# Patient Record
Sex: Male | Born: 1970 | Race: White | Hispanic: No | Smoking: Current every day smoker
Health system: Southern US, Community
[De-identification: ages and names within clinical notes are randomized; demographics above are authoritative.]

## PROBLEM LIST (undated history)

## (undated) DIAGNOSIS — I639 Cerebral infarction, unspecified: Secondary | ICD-10-CM

## (undated) DIAGNOSIS — J449 Chronic obstructive pulmonary disease, unspecified: Secondary | ICD-10-CM

## (undated) DIAGNOSIS — E119 Type 2 diabetes mellitus without complications: Secondary | ICD-10-CM

## (undated) DIAGNOSIS — I10 Essential (primary) hypertension: Secondary | ICD-10-CM

## (undated) HISTORY — DX: Cerebral infarction, unspecified: I63.9

## (undated) HISTORY — PX: BACK SURGERY: SHX140

## (undated) HISTORY — DX: Chronic obstructive pulmonary disease, unspecified: J44.9

## (undated) HISTORY — DX: Essential (primary) hypertension: I10

## (undated) HISTORY — PX: CARDIAC SURGERY: SHX584

---

## 2001-09-27 ENCOUNTER — Encounter: Payer: Self-pay | Admitting: Emergency Medicine

## 2001-09-27 ENCOUNTER — Emergency Department (HOSPITAL_COMMUNITY): Admission: EM | Admit: 2001-09-27 | Discharge: 2001-09-27 | Payer: Self-pay | Admitting: Emergency Medicine

## 2002-07-18 ENCOUNTER — Ambulatory Visit (HOSPITAL_COMMUNITY): Admission: RE | Admit: 2002-07-18 | Discharge: 2002-07-18 | Payer: Self-pay | Admitting: Family Medicine

## 2002-07-18 ENCOUNTER — Encounter: Payer: Self-pay | Admitting: Family Medicine

## 2002-07-19 ENCOUNTER — Encounter: Payer: Self-pay | Admitting: Family Medicine

## 2002-07-19 ENCOUNTER — Ambulatory Visit (HOSPITAL_COMMUNITY): Admission: RE | Admit: 2002-07-19 | Discharge: 2002-07-19 | Payer: Self-pay | Admitting: Family Medicine

## 2002-11-17 ENCOUNTER — Inpatient Hospital Stay (HOSPITAL_COMMUNITY): Admission: EM | Admit: 2002-11-17 | Discharge: 2002-11-21 | Payer: Self-pay | Admitting: Cardiology

## 2004-06-25 ENCOUNTER — Emergency Department (HOSPITAL_COMMUNITY): Admission: EM | Admit: 2004-06-25 | Discharge: 2004-06-25 | Payer: Self-pay | Admitting: Emergency Medicine

## 2004-07-13 ENCOUNTER — Emergency Department (HOSPITAL_COMMUNITY): Admission: EM | Admit: 2004-07-13 | Discharge: 2004-07-13 | Payer: Self-pay | Admitting: Emergency Medicine

## 2004-07-20 ENCOUNTER — Emergency Department (HOSPITAL_COMMUNITY): Admission: EM | Admit: 2004-07-20 | Discharge: 2004-07-20 | Payer: Self-pay | Admitting: *Deleted

## 2004-08-12 ENCOUNTER — Emergency Department (HOSPITAL_COMMUNITY): Admission: EM | Admit: 2004-08-12 | Discharge: 2004-08-12 | Payer: Self-pay | Admitting: Emergency Medicine

## 2004-08-14 ENCOUNTER — Emergency Department (HOSPITAL_COMMUNITY): Admission: EM | Admit: 2004-08-14 | Discharge: 2004-08-14 | Payer: Self-pay | Admitting: Emergency Medicine

## 2004-08-26 ENCOUNTER — Emergency Department (HOSPITAL_COMMUNITY): Admission: EM | Admit: 2004-08-26 | Discharge: 2004-08-26 | Payer: Self-pay | Admitting: Emergency Medicine

## 2004-09-11 ENCOUNTER — Emergency Department (HOSPITAL_COMMUNITY): Admission: EM | Admit: 2004-09-11 | Discharge: 2004-09-11 | Payer: Self-pay | Admitting: Emergency Medicine

## 2004-10-04 ENCOUNTER — Emergency Department (HOSPITAL_COMMUNITY): Admission: EM | Admit: 2004-10-04 | Discharge: 2004-10-05 | Payer: Self-pay | Admitting: Emergency Medicine

## 2004-10-16 ENCOUNTER — Emergency Department (HOSPITAL_COMMUNITY): Admission: EM | Admit: 2004-10-16 | Discharge: 2004-10-17 | Payer: Self-pay | Admitting: Emergency Medicine

## 2004-11-07 ENCOUNTER — Emergency Department (HOSPITAL_COMMUNITY): Admission: EM | Admit: 2004-11-07 | Discharge: 2004-11-07 | Payer: Self-pay | Admitting: Emergency Medicine

## 2005-02-19 ENCOUNTER — Emergency Department: Payer: Self-pay | Admitting: Emergency Medicine

## 2005-04-13 ENCOUNTER — Other Ambulatory Visit: Payer: Self-pay

## 2005-04-13 ENCOUNTER — Emergency Department: Payer: Self-pay | Admitting: Unknown Physician Specialty

## 2005-04-18 ENCOUNTER — Emergency Department: Payer: Self-pay | Admitting: Unknown Physician Specialty

## 2005-05-09 ENCOUNTER — Emergency Department: Payer: Self-pay | Admitting: Emergency Medicine

## 2005-06-25 ENCOUNTER — Emergency Department: Payer: Self-pay | Admitting: Emergency Medicine

## 2005-08-12 ENCOUNTER — Emergency Department: Payer: Self-pay | Admitting: Emergency Medicine

## 2005-08-22 ENCOUNTER — Emergency Department: Payer: Self-pay | Admitting: Emergency Medicine

## 2005-08-27 ENCOUNTER — Inpatient Hospital Stay: Payer: Self-pay | Admitting: Specialist

## 2005-10-08 ENCOUNTER — Emergency Department: Payer: Self-pay | Admitting: Emergency Medicine

## 2005-12-03 ENCOUNTER — Other Ambulatory Visit: Payer: Self-pay

## 2005-12-03 ENCOUNTER — Emergency Department: Payer: Self-pay | Admitting: Emergency Medicine

## 2005-12-07 ENCOUNTER — Other Ambulatory Visit: Payer: Self-pay

## 2005-12-07 ENCOUNTER — Emergency Department: Payer: Self-pay | Admitting: Emergency Medicine

## 2005-12-19 ENCOUNTER — Emergency Department: Payer: Self-pay | Admitting: Emergency Medicine

## 2006-03-27 ENCOUNTER — Emergency Department: Payer: Self-pay | Admitting: Emergency Medicine

## 2006-04-26 ENCOUNTER — Emergency Department: Payer: Self-pay | Admitting: Emergency Medicine

## 2007-01-03 ENCOUNTER — Emergency Department: Payer: Self-pay | Admitting: Internal Medicine

## 2008-09-26 ENCOUNTER — Emergency Department (HOSPITAL_COMMUNITY): Admission: EM | Admit: 2008-09-26 | Discharge: 2008-09-26 | Payer: Self-pay | Admitting: Emergency Medicine

## 2008-09-30 ENCOUNTER — Emergency Department (HOSPITAL_COMMUNITY): Admission: EM | Admit: 2008-09-30 | Discharge: 2008-09-30 | Payer: Self-pay | Admitting: Emergency Medicine

## 2009-02-04 ENCOUNTER — Emergency Department (HOSPITAL_COMMUNITY): Admission: EM | Admit: 2009-02-04 | Discharge: 2009-02-05 | Payer: Self-pay | Admitting: Emergency Medicine

## 2009-02-04 ENCOUNTER — Emergency Department (HOSPITAL_COMMUNITY): Admission: EM | Admit: 2009-02-04 | Discharge: 2009-02-04 | Payer: Self-pay | Admitting: Emergency Medicine

## 2009-02-05 ENCOUNTER — Ambulatory Visit: Payer: Self-pay | Admitting: Psychiatry

## 2009-02-05 ENCOUNTER — Inpatient Hospital Stay (HOSPITAL_COMMUNITY): Admission: AD | Admit: 2009-02-05 | Discharge: 2009-02-22 | Payer: Self-pay | Admitting: Psychiatry

## 2010-01-30 ENCOUNTER — Inpatient Hospital Stay (HOSPITAL_COMMUNITY): Admission: EM | Admit: 2010-01-30 | Discharge: 2010-01-31 | Payer: Self-pay | Admitting: Emergency Medicine

## 2010-01-31 ENCOUNTER — Ambulatory Visit: Payer: Self-pay | Admitting: Psychiatry

## 2010-01-31 ENCOUNTER — Inpatient Hospital Stay (HOSPITAL_COMMUNITY): Admission: AD | Admit: 2010-01-31 | Discharge: 2010-02-06 | Payer: Self-pay | Admitting: Psychiatry

## 2010-02-07 ENCOUNTER — Emergency Department (HOSPITAL_COMMUNITY)
Admission: EM | Admit: 2010-02-07 | Discharge: 2010-02-07 | Payer: Self-pay | Source: Home / Self Care | Admitting: Emergency Medicine

## 2010-03-16 ENCOUNTER — Ambulatory Visit: Payer: Self-pay | Admitting: Psychiatry

## 2010-04-20 ENCOUNTER — Emergency Department (HOSPITAL_COMMUNITY)
Admission: EM | Admit: 2010-04-20 | Discharge: 2010-04-21 | Payer: Self-pay | Source: Home / Self Care | Admitting: Emergency Medicine

## 2010-04-23 ENCOUNTER — Emergency Department (HOSPITAL_COMMUNITY)
Admission: EM | Admit: 2010-04-23 | Discharge: 2010-04-23 | Payer: Self-pay | Source: Home / Self Care | Admitting: Family Medicine

## 2010-04-24 ENCOUNTER — Emergency Department (HOSPITAL_COMMUNITY)
Admission: EM | Admit: 2010-04-24 | Discharge: 2010-04-25 | Disposition: A | Discharge status: Other Acute Inpatient Hospital | Payer: Self-pay | Source: Home / Self Care | Admitting: Emergency Medicine

## 2010-04-24 LAB — BASIC METABOLIC PANEL
BUN: 3 mg/dL — ABNORMAL LOW (ref 6–23)
CO2: 28 mEq/L (ref 19–32)
Calcium: 9.2 mg/dL (ref 8.4–10.5)
Chloride: 96 mEq/L (ref 96–112)
Creatinine, Ser: 1.15 mg/dL (ref 0.4–1.5)
GFR calc Af Amer: >60 mL/min (ref >60)
GFR calc non Af Amer: >60 mL/min (ref >60)
Glucose, Bld: 102 mg/dL — ABNORMAL HIGH (ref 70–99)
Potassium: 3.8 mEq/L (ref 3.5–5.1)
Sodium: 135 mEq/L (ref 135–145)

## 2010-04-24 LAB — ETHANOL: Alcohol, Ethyl (B): 41 mg/dL — ABNORMAL HIGH (ref 0–10)

## 2010-04-24 LAB — RAPID URINE DRUG SCREEN, HOSP PERFORMED
Amphetamines: NOT DETECTED
Barbiturates: NOT DETECTED
Benzodiazepines: POSITIVE — AB
Cocaine: POSITIVE — AB
Opiates: NOT DETECTED
Tetrahydrocannabinol: NOT DETECTED

## 2010-04-24 LAB — CBC
HCT: 45.2 % (ref 39.0–52.0)
Hemoglobin: 15.3 g/dL (ref 13.0–17.0)
MCH: 32.1 pg (ref 26.0–34.0)
MCHC: 33.8 g/dL (ref 30.0–36.0)
MCV: 95 fL (ref 78.0–100.0)
Platelets: 264 10*3/uL (ref 150–400)
RBC: 4.76 MIL/uL (ref 4.22–5.81)
RDW: 15.2 % (ref 11.5–15.5)
WBC: 9.6 10*3/uL (ref 4.0–10.5)

## 2010-04-24 LAB — TRICYCLICS SCREEN, URINE: TCA Scrn: NOT DETECTED

## 2010-04-25 ENCOUNTER — Emergency Department (HOSPITAL_COMMUNITY)
Admission: EM | Admit: 2010-04-25 | Discharge: 2010-04-26 | Disposition: A | Discharge status: Other Acute Inpatient Hospital | Payer: Self-pay | Source: Home / Self Care | Admitting: Emergency Medicine

## 2010-04-25 LAB — DIFFERENTIAL
Basophils Absolute: 0.1 10*3/uL (ref 0.0–0.1)
Basophils Relative: 1 % (ref 0–1)
Eosinophils Absolute: 0.3 10*3/uL (ref 0.0–0.7)
Eosinophils Relative: 3 % (ref 0–5)
Lymphocytes Relative: 24 % (ref 12–46)
Lymphs Abs: 2.3 10*3/uL (ref 0.7–4.0)
Monocytes Absolute: 0.7 10*3/uL (ref 0.1–1.0)
Monocytes Relative: 8 % (ref 3–12)
Neutro Abs: 6.3 10*3/uL (ref 1.7–7.7)
Neutrophils Relative %: 65 % (ref 43–77)

## 2010-04-25 LAB — POCT CARDIAC MARKERS
CKMB, poc: <1 ng/mL — ABNORMAL LOW (ref 1.0–8.0)
Myoglobin, poc: 76.3 ng/mL (ref 12–200)
Troponin i, poc: <0.05 ng/mL (ref 0.00–0.09)

## 2010-04-26 ENCOUNTER — Inpatient Hospital Stay (HOSPITAL_COMMUNITY)
Admission: AD | Admit: 2010-04-26 | Discharge: 2010-05-02 | Payer: Self-pay | Source: Home / Self Care | Attending: Psychiatry | Admitting: Psychiatry

## 2010-04-26 DIAGNOSIS — F1910 Other psychoactive substance abuse, uncomplicated: Secondary | ICD-10-CM

## 2010-04-29 ENCOUNTER — Emergency Department (HOSPITAL_COMMUNITY)
Admission: EM | Admit: 2010-04-29 | Discharge: 2010-04-29 | Payer: Self-pay | Source: Home / Self Care | Admitting: Emergency Medicine

## 2010-05-04 ENCOUNTER — Emergency Department (HOSPITAL_COMMUNITY)
Admission: EM | Admit: 2010-05-04 | Discharge: 2010-05-04 | Payer: Self-pay | Source: Home / Self Care | Admitting: Emergency Medicine

## 2010-05-05 ENCOUNTER — Emergency Department (HOSPITAL_COMMUNITY)
Admission: EM | Admit: 2010-05-05 | Discharge: 2010-05-05 | Payer: Self-pay | Source: Home / Self Care | Admitting: Emergency Medicine

## 2010-05-06 ENCOUNTER — Emergency Department (HOSPITAL_COMMUNITY)
Admission: EM | Admit: 2010-05-06 | Discharge: 2010-05-06 | Payer: Self-pay | Source: Home / Self Care | Admitting: Emergency Medicine

## 2010-05-06 LAB — DIFFERENTIAL
Basophils Absolute: 0.1 10*3/uL (ref 0.0–0.1)
Basophils Absolute: 0.1 10*3/uL (ref 0.0–0.1)
Basophils Relative: 1 % (ref 0–1)
Basophils Relative: 1 % (ref 0–1)
Eosinophils Absolute: 0.4 10*3/uL (ref 0.0–0.7)
Eosinophils Absolute: 0.5 10*3/uL (ref 0.0–0.7)
Eosinophils Relative: 5 % (ref 0–5)
Eosinophils Relative: 7 % — ABNORMAL HIGH (ref 0–5)
Lymphocytes Relative: 35 % (ref 12–46)
Lymphocytes Relative: 25 % (ref 12–46)
Lymphs Abs: 2.5 10*3/uL (ref 0.7–4.0)
Lymphs Abs: 1.6 10*3/uL (ref 0.7–4.0)
Monocytes Absolute: 0.6 10*3/uL (ref 0.1–1.0)
Monocytes Absolute: 0.7 10*3/uL (ref 0.1–1.0)
Monocytes Relative: 8 % (ref 3–12)
Monocytes Relative: 10 % (ref 3–12)
Neutro Abs: 3.6 10*3/uL (ref 1.7–7.7)
Neutro Abs: 3.7 10*3/uL (ref 1.7–7.7)
Neutrophils Relative %: 50 % (ref 43–77)
Neutrophils Relative %: 57 % (ref 43–77)

## 2010-05-06 LAB — URINE CULTURE
Colony Count: >=100000
Culture  Setup Time: 201201091229

## 2010-05-06 LAB — COMPREHENSIVE METABOLIC PANEL
ALT: 69 U/L — ABNORMAL HIGH (ref 0–53)
AST: 39 U/L — ABNORMAL HIGH (ref 0–37)
Albumin: 3.7 g/dL (ref 3.5–5.2)
Alkaline Phosphatase: 59 U/L (ref 39–117)
BUN: 10 mg/dL (ref 6–23)
CO2: 25 mEq/L (ref 19–32)
Calcium: 9.7 mg/dL (ref 8.4–10.5)
Chloride: 101 mEq/L (ref 96–112)
Creatinine, Ser: 1.24 mg/dL (ref 0.4–1.5)
GFR calc Af Amer: >60 mL/min (ref >60)
GFR calc non Af Amer: >60 mL/min (ref >60)
Glucose, Bld: 132 mg/dL — ABNORMAL HIGH (ref 70–99)
Potassium: 4 mEq/L (ref 3.5–5.1)
Sodium: 137 mEq/L (ref 135–145)
Total Bilirubin: 0.6 mg/dL (ref 0.3–1.2)
Total Protein: 6.7 g/dL (ref 6.0–8.3)

## 2010-05-06 LAB — URINE MICROSCOPIC-ADD ON

## 2010-05-06 LAB — POCT I-STAT, CHEM 8
BUN: 12 mg/dL (ref 6–23)
Calcium, Ion: 1.08 mmol/L — ABNORMAL LOW (ref 1.12–1.32)
Chloride: 106 meq/L (ref 96–112)
Creatinine, Ser: 1.1 mg/dL (ref 0.4–1.5)
Glucose, Bld: 96 mg/dL (ref 70–99)
HCT: 48 % (ref 39.0–52.0)
Hemoglobin: 16.3 g/dL (ref 13.0–17.0)
Potassium: 4.1 meq/L (ref 3.5–5.1)
Sodium: 139 meq/L (ref 135–145)
TCO2: 26 mmol/L (ref 0–100)

## 2010-05-06 LAB — URINALYSIS, ROUTINE W REFLEX MICROSCOPIC
Bilirubin Urine: NEGATIVE
Bilirubin Urine: NEGATIVE
Bilirubin Urine: NEGATIVE
Ketones, ur: NEGATIVE mg/dL
Ketones, ur: NEGATIVE mg/dL
Ketones, ur: NEGATIVE mg/dL
Nitrite: NEGATIVE
Nitrite: NEGATIVE
Nitrite: NEGATIVE
Protein, ur: 30 mg/dL — AB
Protein, ur: 100 mg/dL — AB
Protein, ur: NEGATIVE mg/dL
Specific Gravity, Urine: 1.006 (ref 1.005–1.030)
Specific Gravity, Urine: 1.005 (ref 1.005–1.030)
Specific Gravity, Urine: 1.009 (ref 1.005–1.030)
Urine Glucose, Fasting: NEGATIVE mg/dL
Urine Glucose, Fasting: NEGATIVE mg/dL
Urine Glucose, Fasting: NEGATIVE mg/dL
Urobilinogen, UA: 0.2 mg/dL (ref 0.0–1.0)
Urobilinogen, UA: 0.2 mg/dL (ref 0.0–1.0)
Urobilinogen, UA: 0.2 mg/dL (ref 0.0–1.0)
pH: 6.5 (ref 5.0–8.0)
pH: 6 (ref 5.0–8.0)
pH: 6 (ref 5.0–8.0)

## 2010-05-06 LAB — RAPID URINE DRUG SCREEN, HOSP PERFORMED
Amphetamines: NOT DETECTED
Barbiturates: NOT DETECTED
Benzodiazepines: POSITIVE — AB
Cocaine: NOT DETECTED
Opiates: NOT DETECTED
Tetrahydrocannabinol: NOT DETECTED

## 2010-05-06 LAB — URINALYSIS, MICROSCOPIC ONLY
Bilirubin Urine: NEGATIVE
Ketones, ur: NEGATIVE mg/dL
Nitrite: NEGATIVE
Protein, ur: 30 mg/dL — AB
Specific Gravity, Urine: 1.009 (ref 1.005–1.030)
Urine Glucose, Fasting: NEGATIVE mg/dL
Urobilinogen, UA: 0.2 mg/dL (ref 0.0–1.0)
pH: 6.5 (ref 5.0–8.0)

## 2010-05-06 LAB — CBC
HCT: 45.9 % (ref 39.0–52.0)
HCT: 43.7 % (ref 39.0–52.0)
Hemoglobin: 15.3 g/dL (ref 13.0–17.0)
Hemoglobin: 14.9 g/dL (ref 13.0–17.0)
MCH: 31.6 pg (ref 26.0–34.0)
MCH: 32 pg (ref 26.0–34.0)
MCHC: 33.3 g/dL (ref 30.0–36.0)
MCHC: 34.1 g/dL (ref 30.0–36.0)
MCV: 94.8 fL (ref 78.0–100.0)
MCV: 94 fL (ref 78.0–100.0)
Platelets: 260 10*3/uL (ref 150–400)
Platelets: 262 10*3/uL (ref 150–400)
RBC: 4.84 MIL/uL (ref 4.22–5.81)
RBC: 4.65 MIL/uL (ref 4.22–5.81)
RDW: 15 % (ref 11.5–15.5)
RDW: 14.8 % (ref 11.5–15.5)
WBC: 7.2 10*3/uL (ref 4.0–10.5)
WBC: 6.5 10*3/uL (ref 4.0–10.5)

## 2010-05-06 LAB — ETHANOL

## 2010-05-07 ENCOUNTER — Emergency Department (HOSPITAL_COMMUNITY)
Admission: EM | Admit: 2010-05-07 | Discharge: 2010-05-07 | Payer: Self-pay | Source: Home / Self Care | Admitting: Emergency Medicine

## 2010-05-08 LAB — URINALYSIS, ROUTINE W REFLEX MICROSCOPIC
Bilirubin Urine: NEGATIVE
Bilirubin Urine: NEGATIVE
Ketones, ur: NEGATIVE mg/dL
Ketones, ur: NEGATIVE mg/dL
Nitrite: NEGATIVE
Nitrite: POSITIVE — AB
Protein, ur: NEGATIVE mg/dL
Protein, ur: 30 mg/dL — AB
Specific Gravity, Urine: 1.017 (ref 1.005–1.030)
Specific Gravity, Urine: 1.023 (ref 1.005–1.030)
Urine Glucose, Fasting: NEGATIVE mg/dL
Urine Glucose, Fasting: NEGATIVE mg/dL
Urobilinogen, UA: 1 mg/dL (ref 0.0–1.0)
Urobilinogen, UA: 0.2 mg/dL (ref 0.0–1.0)
pH: 7.5 (ref 5.0–8.0)
pH: 6.5 (ref 5.0–8.0)

## 2010-05-08 LAB — URINE MICROSCOPIC-ADD ON

## 2010-05-08 LAB — BASIC METABOLIC PANEL
BUN: 15 mg/dL (ref 6–23)
CO2: 25 mEq/L (ref 19–32)
Calcium: 9.3 mg/dL (ref 8.4–10.5)
Chloride: 105 mEq/L (ref 96–112)
Creatinine, Ser: 1.15 mg/dL (ref 0.4–1.5)
GFR calc Af Amer: >60 mL/min (ref >60)
GFR calc non Af Amer: >60 mL/min (ref >60)
Glucose, Bld: 97 mg/dL (ref 70–99)
Potassium: 4.5 mEq/L (ref 3.5–5.1)
Sodium: 138 mEq/L (ref 135–145)

## 2010-05-08 LAB — CBC
HCT: 46 % (ref 39.0–52.0)
Hemoglobin: 15.5 g/dL (ref 13.0–17.0)
MCH: 32.1 pg (ref 26.0–34.0)
MCHC: 33.7 g/dL (ref 30.0–36.0)
MCV: 95.2 fL (ref 78.0–100.0)
Platelets: 275 10*3/uL (ref 150–400)
RBC: 4.83 MIL/uL (ref 4.22–5.81)
RDW: 14.7 % (ref 11.5–15.5)
WBC: 7.3 10*3/uL (ref 4.0–10.5)

## 2010-05-08 LAB — OCCULT BLOOD, POC DEVICE: Fecal Occult Bld: NEGATIVE

## 2010-05-08 LAB — URINE CULTURE
Colony Count: >=100000
Culture  Setup Time: 201201161548

## 2010-05-08 LAB — RPR: RPR Ser Ql: NONREACTIVE

## 2010-05-09 ENCOUNTER — Emergency Department (HOSPITAL_COMMUNITY)
Admission: EM | Admit: 2010-05-09 | Discharge: 2010-05-09 | Payer: Self-pay | Source: Home / Self Care | Admitting: Emergency Medicine

## 2010-05-11 ENCOUNTER — Emergency Department (HOSPITAL_COMMUNITY)
Admission: EM | Admit: 2010-05-11 | Discharge: 2010-05-12 | Disposition: A | Discharge status: Other Acute Inpatient Hospital | Payer: Self-pay | Source: Home / Self Care | Admitting: Emergency Medicine

## 2010-05-12 ENCOUNTER — Inpatient Hospital Stay (HOSPITAL_COMMUNITY)
Admission: AD | Admit: 2010-05-12 | Discharge: 2010-05-16 | Payer: Self-pay | Source: Other Acute Inpatient Hospital | Attending: Psychiatry | Admitting: Psychiatry

## 2010-05-12 DIAGNOSIS — F332 Major depressive disorder, recurrent severe without psychotic features: Secondary | ICD-10-CM

## 2010-05-13 LAB — URINE CULTURE
Colony Count: 90000
Culture  Setup Time: 201201171534

## 2010-05-13 LAB — GC/CHLAMYDIA PROBE AMP, GENITAL
Chlamydia, DNA Probe: NEGATIVE
GC Probe Amp, Genital: NEGATIVE

## 2010-05-14 LAB — SALICYLATE LEVEL: Salicylate Lvl: <4 mg/dL (ref 2.8–20.0)

## 2010-05-14 LAB — DIFFERENTIAL
Basophils Absolute: 0.1 10*3/uL (ref 0.0–0.1)
Basophils Relative: 1 % (ref 0–1)
Eosinophils Absolute: 0.5 10*3/uL (ref 0.0–0.7)
Eosinophils Relative: 6 % — ABNORMAL HIGH (ref 0–5)
Lymphocytes Relative: 32 % (ref 12–46)
Lymphs Abs: 2.4 10*3/uL (ref 0.7–4.0)
Monocytes Absolute: 0.7 10*3/uL (ref 0.1–1.0)
Monocytes Relative: 9 % (ref 3–12)
Neutro Abs: 3.9 10*3/uL (ref 1.7–7.7)
Neutrophils Relative %: 52 % (ref 43–77)

## 2010-05-14 LAB — URINALYSIS, ROUTINE W REFLEX MICROSCOPIC
Protein, ur: 30 mg/dL — AB
Specific Gravity, Urine: 1.023 (ref 1.005–1.030)
Urine Glucose, Fasting: NEGATIVE mg/dL
pH: 5.5 (ref 5.0–8.0)

## 2010-05-14 LAB — COMPREHENSIVE METABOLIC PANEL
ALT: 53 U/L (ref 0–53)
AST: 36 U/L (ref 0–37)
Calcium: 9.1 mg/dL (ref 8.4–10.5)
Chloride: 102 mEq/L (ref 96–112)
Creatinine, Ser: 1.11 mg/dL (ref 0.4–1.5)
GFR calc Af Amer: >60 mL/min (ref >60)
Glucose, Bld: 117 mg/dL — ABNORMAL HIGH (ref 70–99)
Sodium: 138 mEq/L (ref 135–145)
Total Bilirubin: 0.3 mg/dL (ref 0.3–1.2)
Total Protein: 6.6 g/dL (ref 6.0–8.3)

## 2010-05-14 LAB — URINE MICROSCOPIC-ADD ON

## 2010-05-14 LAB — ETHANOL: Alcohol, Ethyl (B): <5 mg/dL (ref 0–10)

## 2010-05-14 LAB — CBC
MCHC: 35.1 g/dL (ref 30.0–36.0)
Platelets: 239 10*3/uL (ref 150–400)
RBC: 4.36 MIL/uL (ref 4.22–5.81)
RDW: 14.2 % (ref 11.5–15.5)
WBC: 7.5 10*3/uL (ref 4.0–10.5)

## 2010-05-14 LAB — RAPID URINE DRUG SCREEN, HOSP PERFORMED: Barbiturates: NOT DETECTED

## 2010-05-14 LAB — LIPASE, BLOOD: Lipase: 32 U/L (ref 11–59)

## 2010-05-14 LAB — ACETAMINOPHEN LEVEL
Acetaminophen (Tylenol), Serum: 17.3 ug/mL (ref 10–30)
Acetaminophen (Tylenol), Serum: 16.4 ug/mL (ref 10–30)
Acetaminophen (Tylenol), Serum: <10 ug/mL — ABNORMAL LOW (ref 10–30)

## 2010-05-14 LAB — URINE CULTURE

## 2010-05-15 LAB — VITAMIN B12: Vitamin B-12: 385 pg/mL (ref 211–911)

## 2010-05-15 LAB — LIPID PANEL
Cholesterol: 312 mg/dL — ABNORMAL HIGH (ref 0–200)
HDL: 43 mg/dL (ref >39)
Triglycerides: 393 mg/dL — ABNORMAL HIGH (ref <150)

## 2010-05-15 LAB — HEMOGLOBIN A1C: Mean Plasma Glucose: 105 mg/dL (ref <117)

## 2010-05-15 LAB — TSH: TSH: 0.87 u[IU]/mL (ref 0.350–4.500)

## 2010-05-15 NOTE — Consult Note (Addendum)
Andrew [name]                ACCOUNT NO.:  [id number]  MEDICAL RECORD NO.:  [id number]          PATIENT TYPE:  EMS  LOCATION:  ED                           FACILITY:  [facility]  PHYSICIAN:  Marlis Edelson, DO        DATE OF BIRTH:  03-23-1971  DATE OF CONSULTATION:  05/12/2010 DATE OF DISCHARGE:                                CONSULTATION   CONSULTATION REQUESTED BY:  Ilene Qua ED physician staff.  CHIEF COMPLAINT:  Suicidal thoughts.  HISTORY OF PRESENT ILLNESS:  Andrew [name] is a 40 year old Caucasian male admitted to the psychiatric section of the [facility] emergency department on May 11, 2010, when he presented status post overdose.  He related that he is suicidal and depressed and that he has been using drugs and alcohol.  He states, "I just give up." "I am tired of it, I don't want to live any more."  He stated that he tried to stab himself, but his brother took the knife away.  He tried toshoot himself, but his brother took the gun away, so he went to overdose.  He stated he overdosed on other unspecified medication but the one that he was sure about was Percocet.  He related that was his first time using opiates in this manner.  He has no history of addiction to opiates.  He states he has been depressed for a long time and the only thing he does about it is drugs and alcohol.  He referenced being on an antidepressant and Seroquel but could not recall the name of the antidepressant.  He has been using alcohol daily with a case of beer being consumed on a daily basis.  He stated he has been drinking since he was 28-66 years of age.  His last use was before admission.  He has had a history of alcohol withdrawal seizures and DTs in the past.  Cocaine use began around the age of 32.  He states he does not do it any more but his last use was 2 weeks ago.  He stated he does about an eight- ball daily.  He denied any other  substance use.  He reports having depression for some time and having auditory hallucinations.  PAST PSYCHIATRIC HISTORY:  Andrew [name] has been admitted to the Behavioral Health Unit in the past.  He has previous diagnosis including polysubstance abuse, alcohol dependence, substance-induced auditory hallucinations, possibility of personality disorder and mood disorder, NOS.  He reports previous suicidal attempts x3-4.  He has also had a history of cutting in the past which reduces pain.  He reports that his antidepressant is not working.  PAST MEDICAL HISTORY:  Previous appendectomy.  He states he is currently being treated for a prostate infection.  He has no history of loss of consciousness or head trauma, seizure history has only been associated with withdrawal of alcohol.  ALLERGIES:  TORADOL and PENICILLIN.  CURRENT MEDICATIONS:  Cipro 500 mg p.o. b.i.d. x26 days.  SOCIAL HISTORY:  He is divorced, has been married x1, no children. Education ninth grade and then obtained a  GED.  Work, Holiday representative. Military history none.  Legal history none.  Religious preference is none.  TRAUMA HISTORY:  He denies any significant history of childhood trauma.  FAMILY HISTORY:  He relates drug abuse on both sides of his family.  SUBSTANCE ABUSE HISTORY:  In addition to the above-noted use, he smokes approximately two packs of cigarettes per day.  He has smoked since the age of 24.  MENTAL STATUS EXAMINATION:  He is a well-developed, well-nourished Caucasian male who appeared sad in appearance.  He was wearing hospital issued pajamas. He was lying on a hospital gurney in the emergency department on his right side.  His eyes were closed.  He established no eye contact with the examiner.  His speech was clear and coherent, regular in rate and rhythm.  His mood was expressed as really depressed, "I just want to die, I will get it right some day."  Affect blunted. Thought process was linear.   Thought content positive for voices.  He is stating he hears 1-2 voices but it is hard to describe what they are saying.  He relates current paranoid thoughts, no delusions.  Positive suicidal thoughts, no homicidality.  Judgment impaired, insight shallow. Psychomotor activity diminished.  ASSESSMENT:  AXIS I: 1. Status post overdose as a suicidal attempt. 2. 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 3. Polysubstance abuse (alcohol and cocaine). 4. Substance-induced auditory hallucinations. AXIS II:  Rule out personality disorder based on previous history and psychiatric assessments. AXIS III:  Prostate infection. AXIS IV:  Chronic and severe substance use. AXIS V:  Global assessment of functioning, 20.  TREATMENT PLAN:  Andrew [name] will remain in the emergency department until a bed is available in the Behavioral Health Unit.  I do recommend because of severity of his suicidal ideation, that he be admitted to the [facility].  Involuntary admission may be needed if the patient does not agree to inpatient stay.  Discontinue current p.r.n. Ativan and start Librium detox protocol including supplementation with thiamine.  His previous psychiatric medications include Celexa 20 mg daily which has been ineffective, that will be discontinued as well as hydroxyzine 25 mg every 6 hours as needed because we are uncertain if he may have overdosed on hydroxyzine.  He can continue Seroquel 400 mg at bedtime which may help with sleep and auditory hallucinations.  Further adjustments and recommendations can be made following his detoxification.  Thank you for this consultation.          ______________________________ Marlis Edelson, DO     DB/MEDQ  D:  05/12/2010  T:  05/12/2010  Job:  [id number]  Electronically Signed by Marlis Edelson MD on 05/15/2010 10:10:15 PM

## 2010-05-15 NOTE — H&P (Addendum)
  NAMEYI, HAUGAN                ACCOUNT NO.:  [id number]  MEDICAL RECORD NO.:  [id number]          PATIENT TYPE:  IPS  LOCATION:  0508                          FACILITY:  [facility]  PHYSICIAN:  Marlis Edelson, DO        DATE OF BIRTH:  08/05/1970  DATE OF ADMISSION:  05/12/2010 DATE OF DISCHARGE:                      PSYCHIATRIC ADMISSION ASSESSMENT   CHIEF COMPLAINT:  Depression.  HISTORY OF A CHIEF COMPLAINT:  Andrew [name] is a 40 year old Caucasian male admitted with suicidal ideation and status post overdose on Percocet.  He has a history of polysubstance dependence and mood disorder, NOS.  He had had previous suicidal attempts and Behavioral Health hospitalizations.  He was seen initially in the [facility] Emergency Department and was diagnosed with polysubstance dependence, substance-induced auditory hallucinations, and mood disorder, likely secondary to substance substances.  His overdose was due to suicidal ideation.  He was placed on a Librium detox protocol and transferred to [facility] due to increased suicidal ideation.  Please see the dictated psychiatric consultation of 05/12/2010 for past medical history, past psychiatric history, medications, social history, substance abuse, and family history.  MENTAL STATUS EXAMINATION:  On the day of admission, he was more alert, pleasant, cooperative, and engaging today.  His eye contact was more appropriate.  Motor behavior within normal limits.  His speech was clear and coherent.  His mood has improved affect was more full range. Thought process linear, logical, and goal-directed.  He states he feels much clearer.  He is denying perceptual symptoms, ideas of reference, or delusions.  No current suicidal or homicidal thought, intent, or plan. His judgment is grossly intact.  His insight into the need for treatment has increased.  ASSESSMENT:  AXIS I:  Polysubstance dependence,  substance-induced auditory hallucinations, mood disorder, likely secondary to substances, status post overdose with suicidal ideation. AXIS II:  Deferred. AXIS III:  Prostatitis. AXIS IV:  Please see psychiatric consultation. AXIS V:  Currently 45.  TREATMENT PLAN:  He is admitted to the adult unit where he is on a Librium detox protocol.  He is being integrated in substance abuse treatment groups.  We will continue Cipro 500 mg p.o. twice per day for 26 days for prostatitis, continue Seroquel q.h.s. for hallucinosis, and initiate Wellbutrin 150 mg XR p.o. daily for depression.  We will monitor mood, affect, and look at further substance abuse treatment options as well as psychiatric follow-up prior to discharge.          ______________________________ Marlis Edelson, DO     DB/MEDQ  D:  05/13/2010  T:  05/13/2010  Job:  [id number]  Electronically Signed by Marlis Edelson MD on 05/15/2010 10:10:18 PM

## 2010-05-16 LAB — FOLATE RBC: RBC Folate: 589 ng/mL (ref 180–600)

## 2010-05-16 LAB — FOLATE: Folate: 8.2 ng/mL

## 2010-05-22 ENCOUNTER — Emergency Department (HOSPITAL_COMMUNITY)
Admission: EM | Admit: 2010-05-22 | Discharge: 2010-05-24 | Disposition: A | Discharge status: Home / Self Care | Payer: Self-pay | Attending: Emergency Medicine | Admitting: Emergency Medicine

## 2010-05-22 DIAGNOSIS — F1010 Alcohol abuse, uncomplicated: Secondary | ICD-10-CM | POA: Insufficient documentation

## 2010-05-22 DIAGNOSIS — IMO0002 Reserved for concepts with insufficient information to code with codable children: Secondary | ICD-10-CM | POA: Insufficient documentation

## 2010-05-22 DIAGNOSIS — I10 Essential (primary) hypertension: Secondary | ICD-10-CM | POA: Insufficient documentation

## 2010-05-22 DIAGNOSIS — F1020 Alcohol dependence, uncomplicated: Secondary | ICD-10-CM

## 2010-05-22 LAB — DIFFERENTIAL
Lymphocytes Relative: 30 % (ref 12–46)
Lymphs Abs: 2.5 10*3/uL (ref 0.7–4.0)
Monocytes Relative: 7 % (ref 3–12)
Neutrophils Relative %: 60 % (ref 43–77)

## 2010-05-22 LAB — RAPID URINE DRUG SCREEN, HOSP PERFORMED
Opiates: NOT DETECTED
Tetrahydrocannabinol: NOT DETECTED

## 2010-05-22 LAB — BASIC METABOLIC PANEL
BUN: 9 mg/dL (ref 6–23)
CO2: 24 mEq/L (ref 19–32)
Chloride: 104 mEq/L (ref 96–112)
Creatinine, Ser: 1.06 mg/dL (ref 0.4–1.5)
Glucose, Bld: 113 mg/dL — ABNORMAL HIGH (ref 70–99)
Potassium: 4 mEq/L (ref 3.5–5.1)

## 2010-05-22 LAB — CBC
HCT: 48.3 % (ref 39.0–52.0)
Hemoglobin: 17.3 g/dL — ABNORMAL HIGH (ref 13.0–17.0)
MCH: 32.8 pg (ref 26.0–34.0)
MCV: 91.5 fL (ref 78.0–100.0)
RBC: 5.28 MIL/uL (ref 4.22–5.81)
WBC: 8.3 10*3/uL (ref 4.0–10.5)

## 2010-05-23 DIAGNOSIS — F1920 Other psychoactive substance dependence, uncomplicated: Secondary | ICD-10-CM

## 2010-05-27 NOTE — Discharge Summary (Signed)
NAMEDIN, BOOKWALTER                ACCOUNT NO.:  [id number]  MEDICAL RECORD NO.:  [id number]          PATIENT TYPE:  IPS  LOCATION:  0508                          FACILITY:  [facility]  PHYSICIAN:  Marlis Edelson, DO        DATE OF BIRTH:  1971-03-23  DATE OF ADMISSION:  05/12/2010 DATE OF DISCHARGE:  05/16/2010                              DISCHARGE SUMMARY   CHIEF COMPLAINT:  Depression.  HISTORY OF CHIEF COMPLAINT:  Andrew [name] is a 40 year old Caucasian male admitted to the [facility] on May 12, 2010 with chief complaint of depression with suicidal ideation.  He had overdosed on Percocet.  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  He has had previous suicidal attempts and Behavioral Health hospitalizations.  He was seen initially at the [facility] Emergency Department where he was diagnosed with polysubstance dependence, substance-induced auditory hallucinations and mood disorder.  His overdose was due to suicidal ideation.  He was placed on Librium detox protocol and transferred to the [facility] due to suicidal ideation.  The patient was seen in consultation on May 12, 2010.  HOSPITAL COURSE:  Andrew [name] was admitted to the adult unit where he was integrated into substance abuse groups as well as AANA.  In addition, as stated above, he was on a Librium detox protocol due to his history of alcohol consumption.  He met with a psychiatric provider on a daily basis.  He was followed on a daily basis by the discharge planning group, where by May 14, 2010, he denied suicidal ideation or homicidal ideation.  He displayed no hypomania, mania or psychosis during his course of hospitalization.  On January 26, he was noted to have a much broader affect.  His engagement in groups was better than it had been during previous hospital stays.  Hemoglobin A1c was checked and was  within normal limits at 5.3%.  Folate was normal at 8.2 mg/mL.  He was on medications that included Seroquel 600 mg at bedtime, ciprofloxacin 500 mg twice a day to be prescribed through June 07, 2010 due to prostate infection.  He was also on Wellbutrin 150 mg XL daily which was started during the course of hospitalization.  On this medication regimen, he related decrease of suicidal ideation.  He stated he had a new focus which would be life, his job, etc.  He stated he was not thinking about alcohol and drugs at this point.  He was taking in meetings and was talking in the meetings which was something new for him, per the therapist, who had known him from previous admissions.  He reported that he was enjoying AA and he was much more clear.  His ADLs were intact.  His sleep was intact.  He stated it was the best that it had been in a while.  His appetite was good.  He expressed no suicidal or homicidal ideation.  We did discuss his dietary interventions using fish oil to help with management the need to sustain from alcohol long- term.  In addition, we  talked about other forms of substance abuse.  He also reported that his mood was much broader and he felt better than he had felt in some time.  Additional laboratory showed his TSH to be within normal limits.  A vitamin B12 level was 385 pg/mL.  His total cholesterol level was elevated at 312 mg/dL, triglycerides elevated at 393 mg/dL, HDL was good at 43 mg/dL.  LDL was increased at 190 mg/dL and VLDL was increased to 79 mg/dL.  We discussed dietary interventions.  CONSULTATIONS:  None.  COMPLICATIONS:  None.  LABORATORY IMAGING:  As noted above.  PROCEDURES:  None.  MENTAL STATUS EXAM:  Exam at the time of discharge showed him to be pleasant, cooperative, casually dressed with appropriate eye contact. Motor behavior was normal.  Speech was clear, coherent, regular rate, rhythm and volume.  Level of consciousness was alert.   He defined his mood as good, "in fact the best it had been in some time."  Affect was broader, anxiety level zero, depressive level zero, suicidal ideation none, homicidal ideation none.  No thought intent or plan of harming self or others.  He denied perceptual symptoms, ideas of reference, delusions or paranoid ideation.  His judgment was grossly intact.  His insight had increased.  He was cognitively intact.  DISCHARGE DIAGNOSES:  AXIS I:  Polysubstance dependence, mood disorder secondary to polysubstance dependence, history of substance-induced auditory hallucinations. AXIS II:  Deferred. AXIS III:  Prostatitis. AXIS IV:  Long-term substance abuse. AXIS V:  55.  DISCHARGE INSTRUCTIONS:  The patient is to return to the hospital for any marked change in mood or affect.  He is to return for any development of suicidal or homicidal ideation.  In addition, he is to seek emergent treatment for any adverse reactions to medication.  FOLLOWUP:  [facility] on Tuesday, April 23, 2010 at 9:00 a.m.  He also has followup with [facility] on the Monday preceding his Crisis Services followup.  DISCHARGE MEDICATIONS: 1. Wellbutrin 150 mg XL p.o. daily. 2. Ciprofloxacin 500 mg twice daily. 3. Quetiapine 600 mg daily at bedtime.  CONDITION ON DISCHARGE:  Improved with no suicidal or homicidal ideation.  No significant withdrawal symptoms.  No hypomania, mania or psychosis.  PROGNOSIS:  Guarded due to his chronic substance use and history of repeated relapses.          ______________________________ Marlis Edelson, DO     DB/MEDQ  D:  05/26/2010  T:  05/26/2010  Job:  [id number]  Electronically Signed by Marlis Edelson MD on 05/27/2010 07:49:50 PM

## 2010-06-16 ENCOUNTER — Emergency Department (HOSPITAL_COMMUNITY)
Admission: EM | Admit: 2010-06-16 | Discharge: 2010-06-21 | Disposition: A | Discharge status: Other Acute Inpatient Hospital | Payer: Self-pay | Attending: Emergency Medicine | Admitting: Emergency Medicine

## 2010-06-16 ENCOUNTER — Emergency Department (HOSPITAL_COMMUNITY): Payer: Self-pay

## 2010-06-16 DIAGNOSIS — Z981 Arthrodesis status: Secondary | ICD-10-CM | POA: Insufficient documentation

## 2010-06-16 DIAGNOSIS — R42 Dizziness and giddiness: Secondary | ICD-10-CM | POA: Insufficient documentation

## 2010-06-16 DIAGNOSIS — R45851 Suicidal ideations: Secondary | ICD-10-CM | POA: Insufficient documentation

## 2010-06-16 DIAGNOSIS — R319 Hematuria, unspecified: Secondary | ICD-10-CM | POA: Insufficient documentation

## 2010-06-16 DIAGNOSIS — M79609 Pain in unspecified limb: Secondary | ICD-10-CM | POA: Insufficient documentation

## 2010-06-16 DIAGNOSIS — I10 Essential (primary) hypertension: Secondary | ICD-10-CM | POA: Insufficient documentation

## 2010-06-16 DIAGNOSIS — R079 Chest pain, unspecified: Secondary | ICD-10-CM | POA: Insufficient documentation

## 2010-06-16 DIAGNOSIS — M542 Cervicalgia: Secondary | ICD-10-CM | POA: Insufficient documentation

## 2010-06-16 LAB — COMPREHENSIVE METABOLIC PANEL
AST: 45 U/L — ABNORMAL HIGH (ref 0–37)
CO2: 29 mEq/L (ref 19–32)
Calcium: 9 mg/dL (ref 8.4–10.5)
Creatinine, Ser: 1.27 mg/dL (ref 0.4–1.5)
GFR calc Af Amer: >60 mL/min (ref >60)
GFR calc non Af Amer: >60 mL/min (ref >60)
Glucose, Bld: 103 mg/dL — ABNORMAL HIGH (ref 70–99)
Total Protein: 7.3 g/dL (ref 6.0–8.3)

## 2010-06-16 LAB — CBC
Hemoglobin: 14.7 g/dL (ref 13.0–17.0)
MCV: 97.2 fL (ref 78.0–100.0)
Platelets: 199 10*3/uL (ref 150–400)
RBC: 4.64 MIL/uL (ref 4.22–5.81)
WBC: 11.8 10*3/uL — ABNORMAL HIGH (ref 4.0–10.5)

## 2010-06-16 LAB — ETHANOL: Alcohol, Ethyl (B): <5 mg/dL (ref 0–10)

## 2010-06-16 LAB — DIFFERENTIAL
Eosinophils Absolute: 0.3 10*3/uL (ref 0.0–0.7)
Lymphocytes Relative: 13 % (ref 12–46)
Lymphs Abs: 1.5 10*3/uL (ref 0.7–4.0)
Monocytes Relative: 8 % (ref 3–12)
Neutro Abs: 9 10*3/uL — ABNORMAL HIGH (ref 1.7–7.7)
Neutrophils Relative %: 76 % (ref 43–77)

## 2010-06-16 LAB — URINALYSIS, ROUTINE W REFLEX MICROSCOPIC
Nitrite: POSITIVE — AB
Specific Gravity, Urine: 1.015 (ref 1.005–1.030)
Urobilinogen, UA: 1 mg/dL (ref 0.0–1.0)

## 2010-06-16 LAB — URINE MICROSCOPIC-ADD ON

## 2010-06-16 LAB — RAPID URINE DRUG SCREEN, HOSP PERFORMED
Amphetamines: NOT DETECTED
Benzodiazepines: POSITIVE — AB
Cocaine: NOT DETECTED

## 2010-06-16 LAB — POCT CARDIAC MARKERS: Troponin i, poc: <0.05 ng/mL (ref 0.00–0.09)

## 2010-06-17 DIAGNOSIS — F1920 Other psychoactive substance dependence, uncomplicated: Secondary | ICD-10-CM

## 2010-06-18 DIAGNOSIS — F1920 Other psychoactive substance dependence, uncomplicated: Secondary | ICD-10-CM

## 2010-06-18 LAB — URINE CULTURE
Colony Count: 75000
Culture  Setup Time: 201202271035

## 2010-06-19 ENCOUNTER — Emergency Department (HOSPITAL_COMMUNITY): Payer: Self-pay

## 2010-06-19 ENCOUNTER — Encounter (HOSPITAL_COMMUNITY): Payer: Self-pay

## 2010-06-19 DIAGNOSIS — F1920 Other psychoactive substance dependence, uncomplicated: Secondary | ICD-10-CM

## 2010-06-19 LAB — POCT I-STAT, CHEM 8
Chloride: 107 mEq/L (ref 96–112)
HCT: 47 % (ref 39.0–52.0)
Potassium: 3.8 mEq/L (ref 3.5–5.1)
Sodium: 142 mEq/L (ref 135–145)

## 2010-06-20 DIAGNOSIS — F1920 Other psychoactive substance dependence, uncomplicated: Secondary | ICD-10-CM

## 2010-06-21 ENCOUNTER — Inpatient Hospital Stay (HOSPITAL_COMMUNITY)
Admission: RE | Admit: 2010-06-21 | Discharge: 2010-06-25 | DRG: 897 | Disposition: A | Discharge status: Home / Self Care | Payer: PRIVATE HEALTH INSURANCE | Source: Ambulatory Visit | Attending: Psychiatry | Admitting: Psychiatry

## 2010-06-21 DIAGNOSIS — F1020 Alcohol dependence, uncomplicated: Principal | ICD-10-CM

## 2010-06-21 DIAGNOSIS — F319 Bipolar disorder, unspecified: Secondary | ICD-10-CM

## 2010-06-21 DIAGNOSIS — N390 Urinary tract infection, site not specified: Secondary | ICD-10-CM

## 2010-06-21 DIAGNOSIS — Z880 Allergy status to penicillin: Secondary | ICD-10-CM

## 2010-06-21 DIAGNOSIS — F1910 Other psychoactive substance abuse, uncomplicated: Secondary | ICD-10-CM

## 2010-06-21 DIAGNOSIS — Z6379 Other stressful life events affecting family and household: Secondary | ICD-10-CM

## 2010-06-21 DIAGNOSIS — F1920 Other psychoactive substance dependence, uncomplicated: Secondary | ICD-10-CM

## 2010-06-21 DIAGNOSIS — N419 Inflammatory disease of prostate, unspecified: Secondary | ICD-10-CM

## 2010-06-21 DIAGNOSIS — F17200 Nicotine dependence, unspecified, uncomplicated: Secondary | ICD-10-CM

## 2010-06-21 DIAGNOSIS — R45851 Suicidal ideations: Secondary | ICD-10-CM

## 2010-06-22 DIAGNOSIS — F1910 Other psychoactive substance abuse, uncomplicated: Secondary | ICD-10-CM

## 2010-06-23 ENCOUNTER — Ambulatory Visit (HOSPITAL_COMMUNITY)
Admission: EM | Admit: 2010-06-23 | Discharge: 2010-06-23 | Disposition: A | Discharge status: Other Acute Inpatient Hospital | Payer: Self-pay | Source: Ambulatory Visit | Attending: Emergency Medicine | Admitting: Emergency Medicine

## 2010-06-23 DIAGNOSIS — R300 Dysuria: Secondary | ICD-10-CM | POA: Insufficient documentation

## 2010-06-23 LAB — URINALYSIS, ROUTINE W REFLEX MICROSCOPIC
Nitrite: POSITIVE — AB
Specific Gravity, Urine: 1.01 (ref 1.005–1.030)
pH: 6.5 (ref 5.0–8.0)

## 2010-06-23 LAB — URINE MICROSCOPIC-ADD ON

## 2010-06-24 LAB — GC/CHLAMYDIA PROBE AMP, GENITAL: Chlamydia, DNA Probe: NEGATIVE

## 2010-06-24 LAB — URINE CULTURE

## 2010-06-24 NOTE — H&P (Signed)
NAMESHEFFIELD, HAWKER                ACCOUNT NO.:  [id number]  MEDICAL RECORD NO.:  [id number]           PATIENT TYPE:  I  LOCATION:  0307                          FACILITY:  [facility]  PHYSICIAN:  Anselm Jungling, MD  DATE OF BIRTH:  Jun 07, 1970  DATE OF ADMISSION:  06/21/2010 DATE OF DISCHARGE:                      PSYCHIATRIC ADMISSION ASSESSMENT   This is an involuntary admission to the services of Dr. Geralyn Flash. This is a 40 year old widowed white male.  He presented to the emergency department at [facility] back on February 26.  He came in reporting that he had relapsed on alcohol, that he was suicidal.  He stated he was trying to run out in traffic "when the police got me".  He also reported chest pain for the past 3 days.  On admission to the emergency department he was positive for benzodiazepines.  He had no measurable alcohol and he did have a large amount of leukocyte esterase in his urine.  PAST PSYCHIATRIC HISTORY:  He was last with [place] in January, January 22 to January 26.  He has had a number of admissions, all of them related to polysubstance dependence and substance induced auditory hallucinations with mood disorder.  SOCIAL HISTORY:  He was born and raised in [place].  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  He put him on a leave of absence due to his health.  He has been married for 8 years.  However, his wife died suddenly at the age of 10 of natural causes.  His father has removed all the guns out of the home as he previously enjoyed hunting.  FAMILY HISTORY:  Grandmother and grandfather on both sides of the family with a history of alcohol abuse.  ALCOHOL AND DRUG HISTORY:  The patient has a history for alcohol dependence, tobacco abuse and polysubstance abuse.  He states that this last admission really did help him.  MEDICAL HISTORY AND PRIMARY CARE PROVIDER:  He does not have one. Medical problems, this time he has a urinary tract infection.   He is being treated with Cipro.  He does have a past history for cervical spine surgery.  MEDICATIONS: 1. When he was discharged on January 26 he was on Wellbutrin XL 150 mg     p.o. daily. 2. Cipro 500 mg p.o. b.i.d. 3. Seroquel 600 mg at h.s.  DRUG ALLERGIES:  He says he is allergic to TORADOL and PENICILLIN.  I do not know what reactions he has.  POSITIVE PHYSICAL FINDINGS:  Are well documented and in the chart, see above in the HPI for pertinent positives.  MENTAL STATUS EXAM:  Today he is alert and oriented.  He is appropriately groomed, dressed and nourished in his own clothing.  His speech is not pressured.  His mood is euthymic.  His affect has normal range.  Thought processes are clear, rational and goal oriented.  He is requesting discharge as he does not want to lose his housing and needs to pay bills.  Judgment and insight are fair.  Concentration and memory are intact.  Intelligence is at least average.  DIAGNOSES:  AXIS I:  Recent relapse after cousin died on substances, mood disorder not otherwise specified, polysubstance dependence. AXIS II:  Deferred. AXIS III:  Prostatitis. AXIS IV:  Long-term substance abuse. AXIS V:  40.  He was sent over on commitment as Dr. Rogers Blocker felt that he still needed inpatient care.  We probably need to get a urology consult as he has been working with this prostatitis for a while.  He may need a different medication or something.  He states that he is actually taking 900 of Seroquel at night and he would like his Wellbutrin restarted. Dr. Lolly Mustache did see the patient separately.  He felt that the patient had poor eye contact, that his speech was fast and rapid, that he was still having suicidal ideation but no plan.  Now the patient denied that with me.  He is alert with no homicidal ideation and Dr. Lolly Mustache said to consider restarting Depakote.  Estimated length of stay will be another day or two until we can figure out his  disposition.     Mickie Leonarda Salon, P.A.-C.   ______________________________ Anselm Jungling, MD    MD/MEDQ  D:  06/22/2010  T:  06/22/2010  Job:  [id number]  Electronically Signed by Jaci Lazier ADAMS P.A.-C. on 06/23/2010 01:59:38 PM Electronically Signed by Geralyn Flash MD on 06/24/2010 11:06:40 AM

## 2010-06-25 LAB — URINE CULTURE: Colony Count: >=100000

## 2010-06-27 NOTE — Discharge Summary (Signed)
  NAMEBOLDEN, HAGERMAN                ACCOUNT NO.:  [id number]  MEDICAL RECORD NO.:  [id number]           PATIENT TYPE:  I  LOCATION:  0307                          FACILITY:  [facility]  PHYSICIAN:  Anselm Jungling, MD  DATE OF BIRTH:  17-Apr-1971  DATE OF ADMISSION:  06/21/2010 DATE OF DISCHARGE:  06/25/2010                              DISCHARGE SUMMARY   IDENTIFYING DATA/REASON FOR ADMISSION:  This was an inpatient psychiatric admission for "Andrew [name]", a 40 year old male who presented for treatment of alcohol and drug dependence.  He came through the emergency department.  Please refer to the admission note for further details pertaining to the symptoms, circumstances and history that led to his hospitalization.  He was given an initial Axis I diagnosis of alcohol dependence, polysubstance abuse, and history of bipolar disorder NOS.  MEDICAL AND LABORATORY:  The patient was treated in the emergency department for a urinary tract infection.  He was started on a regimen of Cipro 250 mg b.i.d., later changed to Septra, 1 tablet b.i.d. to be continued through April 1.  At the time of discharge, he was referred for medical follow-up regarding this.  He was also continued on his usual Flonase.  There were no other significant medical issues.  HOSPITAL COURSE:  The patient was admitted to the adult inpatient psychiatric service.  He presented as a well-nourished, normally- developed adult male who was pleasant and cooperative.  He participated in therapeutic groups and activities and was a good participant throughout.  He readily acknowledged to substance abuse issues.  He indicated that his mood was relatively good, and he denied suicidal ideation throughout his stay.  The patient worked with the treatment team and case management towards an aftercare plan that could address his treatment needs adequately. Although both residential and outpatient chemical dependency programs were considered,  the patient felt quite certain that he wanted outpatient, because he was fearful that if he went to an inpatient program, that it would result in his losing his residential rental.  He appeared appropriate for discharge on the fifth hospital day.  He agreed to the following aftercare plan.  AFTERCARE:  The patient was to follow-up at [facility] on March 8th at 3:00 p.m.  DISCHARGE MEDICATIONS: 1. Wellbutrin XL 150 mg daily. 2. Depakote 250 mg b.i.d. 3. Septra 1 tablet b.i.d. for April 1. 4. Seroquel 800 mg q.h.s. 5. Flonase 0.4 mg daily.  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 AXIS II: Deferred. AXIS III: Urinary tract infection. AXIS IV: Stressors severe. AXIS V: GAF 50.  The suicide risk assessment was completed upon discharge and the patient was felt to be at minimal risk.     Anselm Jungling, MD     SPB/MEDQ  D:  06/25/2010  T:  06/25/2010  Job:  [id number]  Electronically Signed by Geralyn Flash MD on 06/26/2010 09:39:00 AM

## 2010-07-01 LAB — GC/CHLAMYDIA PROBE AMP, GENITAL
Chlamydia, DNA Probe: NEGATIVE
GC Probe Amp, Genital: NEGATIVE

## 2010-07-01 LAB — CBC
Hemoglobin: 14.4 g/dL (ref 13.0–17.0)
Platelets: 230 10*3/uL (ref 150–400)
Platelets: 245 10*3/uL (ref 150–400)
RBC: 4.62 MIL/uL (ref 4.22–5.81)
RDW: 14.9 % (ref 11.5–15.5)
WBC: 7.8 10*3/uL (ref 4.0–10.5)
WBC: 8.3 10*3/uL (ref 4.0–10.5)

## 2010-07-01 LAB — URINALYSIS, ROUTINE W REFLEX MICROSCOPIC
Glucose, UA: NEGATIVE mg/dL
Glucose, UA: NEGATIVE mg/dL
Nitrite: NEGATIVE
Protein, ur: 30 mg/dL — AB
Protein, ur: NEGATIVE mg/dL
Specific Gravity, Urine: 1.02 (ref 1.005–1.030)
pH: 6 (ref 5.0–8.0)
pH: 7 (ref 5.0–8.0)

## 2010-07-01 LAB — BASIC METABOLIC PANEL
Calcium: 10 mg/dL (ref 8.4–10.5)
Chloride: 99 mEq/L (ref 96–112)
Creatinine, Ser: 1.01 mg/dL (ref 0.4–1.5)
GFR calc Af Amer: >60 mL/min (ref >60)
Sodium: 137 mEq/L (ref 135–145)

## 2010-07-01 LAB — DIFFERENTIAL
Basophils Absolute: 0.1 10*3/uL (ref 0.0–0.1)
Basophils Relative: 1 % (ref 0–1)
Lymphocytes Relative: 26 % (ref 12–46)
Lymphocytes Relative: 31 % (ref 12–46)
Lymphs Abs: 2.4 10*3/uL (ref 0.7–4.0)
Neutro Abs: 5 10*3/uL (ref 1.7–7.7)
Neutrophils Relative %: 52 % (ref 43–77)
Neutrophils Relative %: 60 % (ref 43–77)

## 2010-07-01 LAB — URINE MICROSCOPIC-ADD ON

## 2010-07-01 LAB — POCT I-STAT, CHEM 8
BUN: 6 mg/dL (ref 6–23)
Chloride: 104 mEq/L (ref 96–112)
HCT: 45 % (ref 39.0–52.0)
Sodium: 140 mEq/L (ref 135–145)

## 2010-07-03 LAB — BASIC METABOLIC PANEL
CO2: 26 mEq/L (ref 19–32)
Chloride: 100 mEq/L (ref 96–112)
GFR calc Af Amer: >60 mL/min (ref >60)
Potassium: 3.8 mEq/L (ref 3.5–5.1)
Sodium: 136 mEq/L (ref 135–145)

## 2010-07-03 LAB — COMPREHENSIVE METABOLIC PANEL
ALT: 27 U/L (ref 0–53)
Alkaline Phosphatase: 66 U/L (ref 39–117)
CO2: 25 mEq/L (ref 19–32)
Calcium: 9 mg/dL (ref 8.4–10.5)
Chloride: 105 mEq/L (ref 96–112)
GFR calc non Af Amer: >60 mL/min (ref >60)
Glucose, Bld: 103 mg/dL — ABNORMAL HIGH (ref 70–99)
Potassium: 3.5 mEq/L (ref 3.5–5.1)
Sodium: 137 mEq/L (ref 135–145)
Total Bilirubin: 0.6 mg/dL (ref 0.3–1.2)

## 2010-07-03 LAB — CBC
HCT: 43.3 % (ref 39.0–52.0)
Hemoglobin: 15 g/dL (ref 13.0–17.0)
MCH: 31.4 pg (ref 26.0–34.0)
MCV: 90.6 fL (ref 78.0–100.0)
RBC: 4.78 MIL/uL (ref 4.22–5.81)

## 2010-07-03 LAB — CARDIAC PANEL(CRET KIN+CKTOT+MB+TROPI): Relative Index: INVALID (ref 0.0–2.5)

## 2010-07-03 LAB — BLOOD GAS, ARTERIAL
Drawn by: 257701
O2 Content: 4 L/min
O2 Saturation: 96.1 %
pO2, Arterial: 77.8 mmHg — ABNORMAL LOW (ref 80.0–100.0)

## 2010-07-03 LAB — DIFFERENTIAL
Eosinophils Absolute: 0.2 10*3/uL (ref 0.0–0.7)
Eosinophils Relative: 2 % (ref 0–5)
Lymphs Abs: 1.9 10*3/uL (ref 0.7–4.0)
Monocytes Relative: 7 % (ref 3–12)
Neutrophils Relative %: 75 % (ref 43–77)

## 2010-07-03 LAB — POCT CARDIAC MARKERS: Myoglobin, poc: 64.3 ng/mL (ref 12–200)

## 2010-07-03 LAB — LIPID PANEL: Cholesterol: 245 mg/dL — ABNORMAL HIGH (ref 0–200)

## 2010-07-04 LAB — BASIC METABOLIC PANEL
CO2: 22 mEq/L (ref 19–32)
Calcium: 8.5 mg/dL (ref 8.4–10.5)
Calcium: 9.8 mg/dL (ref 8.4–10.5)
Chloride: 105 mEq/L (ref 96–112)
Creatinine, Ser: 1.08 mg/dL (ref 0.4–1.5)
GFR calc Af Amer: >60 mL/min (ref >60)
GFR calc non Af Amer: >60 mL/min (ref >60)
Glucose, Bld: 113 mg/dL — ABNORMAL HIGH (ref 70–99)
Glucose, Bld: 134 mg/dL — ABNORMAL HIGH (ref 70–99)
Potassium: 3 mEq/L — ABNORMAL LOW (ref 3.5–5.1)
Sodium: 139 mEq/L (ref 135–145)

## 2010-07-04 LAB — DIFFERENTIAL
Basophils Relative: 0 % (ref 0–1)
Eosinophils Absolute: 0.1 10*3/uL (ref 0.0–0.7)
Eosinophils Relative: 1 % (ref 0–5)
Monocytes Relative: 6 % (ref 3–12)
Neutrophils Relative %: 77 % (ref 43–77)

## 2010-07-04 LAB — RAPID URINE DRUG SCREEN, HOSP PERFORMED
Amphetamines: NOT DETECTED
Barbiturates: POSITIVE — AB
Benzodiazepines: NOT DETECTED
Opiates: NOT DETECTED

## 2010-07-04 LAB — CBC
Hemoglobin: 18.3 g/dL — ABNORMAL HIGH (ref 13.0–17.0)
MCH: 31.4 pg (ref 26.0–34.0)
MCHC: 34.2 g/dL (ref 30.0–36.0)
MCV: 91.6 fL (ref 78.0–100.0)
Platelets: 190 10*3/uL (ref 150–400)

## 2010-07-04 LAB — HEMOGLOBIN A1C: Mean Plasma Glucose: 117 mg/dL — ABNORMAL HIGH (ref <117)

## 2010-07-04 LAB — PROTIME-INR: INR: 0.97 (ref 0.00–1.49)

## 2010-07-25 LAB — POCT I-STAT, CHEM 8
BUN: 8 mg/dL (ref 6–23)
Chloride: 102 mEq/L (ref 96–112)
Creatinine, Ser: 1 mg/dL (ref 0.4–1.5)
Glucose, Bld: 109 mg/dL — ABNORMAL HIGH (ref 70–99)
Hemoglobin: 15.6 g/dL (ref 13.0–17.0)
Potassium: 3.8 mEq/L (ref 3.5–5.1)

## 2010-07-25 LAB — CBC
MCV: 95.3 fL (ref 78.0–100.0)
RBC: 4.43 MIL/uL (ref 4.22–5.81)
WBC: 7.9 10*3/uL (ref 4.0–10.5)

## 2010-07-25 LAB — CSF CULTURE W GRAM STAIN

## 2010-07-25 LAB — CSF CELL COUNT WITH DIFFERENTIAL: Tube #: 1

## 2010-07-25 LAB — RAPID URINE DRUG SCREEN, HOSP PERFORMED
Amphetamines: NOT DETECTED
Barbiturates: NOT DETECTED

## 2010-07-25 LAB — DIFFERENTIAL
Eosinophils Absolute: 0.7 10*3/uL (ref 0.0–0.7)
Lymphocytes Relative: 28 % (ref 12–46)
Lymphs Abs: 2.2 10*3/uL (ref 0.7–4.0)
Monocytes Relative: 13 % — ABNORMAL HIGH (ref 3–12)
Neutrophils Relative %: 49 % (ref 43–77)

## 2010-07-25 LAB — ETHANOL: Alcohol, Ethyl (B): <5 mg/dL (ref 0–10)

## 2010-07-29 LAB — COMPREHENSIVE METABOLIC PANEL
ALT: 17 U/L (ref 0–53)
AST: 25 U/L (ref 0–37)
Albumin: 4.2 g/dL (ref 3.5–5.2)
CO2: 27 mEq/L (ref 19–32)
Calcium: 9.3 mg/dL (ref 8.4–10.5)
Chloride: 105 mEq/L (ref 96–112)
Creatinine, Ser: 1.07 mg/dL (ref 0.4–1.5)
GFR calc Af Amer: >60 mL/min (ref >60)
GFR calc non Af Amer: >60 mL/min (ref >60)
Sodium: 139 mEq/L (ref 135–145)
Total Bilirubin: 1.2 mg/dL (ref 0.3–1.2)

## 2010-07-29 LAB — DIFFERENTIAL
Eosinophils Absolute: 0.3 10*3/uL (ref 0.0–0.7)
Eosinophils Relative: 5 % (ref 0–5)
Lymphocytes Relative: 26 % (ref 12–46)
Lymphs Abs: 1.6 10*3/uL (ref 0.7–4.0)
Monocytes Absolute: 0.5 10*3/uL (ref 0.1–1.0)

## 2010-07-29 LAB — URINALYSIS, ROUTINE W REFLEX MICROSCOPIC
Bilirubin Urine: NEGATIVE
Glucose, UA: NEGATIVE mg/dL
Hgb urine dipstick: NEGATIVE
Specific Gravity, Urine: 1.021 (ref 1.005–1.030)
pH: 7.5 (ref 5.0–8.0)

## 2010-07-29 LAB — URINE MICROSCOPIC-ADD ON

## 2010-07-29 LAB — CBC
Platelets: 190 10*3/uL (ref 150–400)
RBC: 5.25 MIL/uL (ref 4.22–5.81)
WBC: 6.1 10*3/uL (ref 4.0–10.5)

## 2010-07-30 ENCOUNTER — Emergency Department (HOSPITAL_COMMUNITY): Payer: Self-pay

## 2010-07-30 ENCOUNTER — Emergency Department (HOSPITAL_COMMUNITY)
Admission: EM | Admit: 2010-07-30 | Discharge: 2010-07-30 | Disposition: A | Discharge status: Home / Self Care | Payer: Self-pay | Attending: Emergency Medicine | Admitting: Emergency Medicine

## 2010-07-30 DIAGNOSIS — R0989 Other specified symptoms and signs involving the circulatory and respiratory systems: Secondary | ICD-10-CM | POA: Insufficient documentation

## 2010-07-30 DIAGNOSIS — R059 Cough, unspecified: Secondary | ICD-10-CM | POA: Insufficient documentation

## 2010-07-30 DIAGNOSIS — R0609 Other forms of dyspnea: Secondary | ICD-10-CM | POA: Insufficient documentation

## 2010-07-30 DIAGNOSIS — R0682 Tachypnea, not elsewhere classified: Secondary | ICD-10-CM | POA: Insufficient documentation

## 2010-07-30 DIAGNOSIS — R05 Cough: Secondary | ICD-10-CM | POA: Insufficient documentation

## 2010-07-30 DIAGNOSIS — R0602 Shortness of breath: Secondary | ICD-10-CM | POA: Insufficient documentation

## 2010-07-30 DIAGNOSIS — J40 Bronchitis, not specified as acute or chronic: Secondary | ICD-10-CM | POA: Insufficient documentation

## 2010-07-30 DIAGNOSIS — R062 Wheezing: Secondary | ICD-10-CM | POA: Insufficient documentation

## 2010-07-30 DIAGNOSIS — Z79899 Other long term (current) drug therapy: Secondary | ICD-10-CM | POA: Insufficient documentation

## 2010-08-03 ENCOUNTER — Emergency Department (HOSPITAL_COMMUNITY)
Admission: EM | Admit: 2010-08-03 | Discharge: 2010-08-03 | Disposition: A | Discharge status: Home / Self Care | Payer: Self-pay | Attending: Emergency Medicine | Admitting: Emergency Medicine

## 2010-08-03 ENCOUNTER — Emergency Department (HOSPITAL_COMMUNITY): Payer: Self-pay

## 2010-08-03 DIAGNOSIS — M25473 Effusion, unspecified ankle: Secondary | ICD-10-CM | POA: Insufficient documentation

## 2010-08-03 DIAGNOSIS — Y92009 Unspecified place in unspecified non-institutional (private) residence as the place of occurrence of the external cause: Secondary | ICD-10-CM | POA: Insufficient documentation

## 2010-08-03 DIAGNOSIS — X500XXA Overexertion from strenuous movement or load, initial encounter: Secondary | ICD-10-CM | POA: Insufficient documentation

## 2010-08-03 DIAGNOSIS — M25476 Effusion, unspecified foot: Secondary | ICD-10-CM | POA: Insufficient documentation

## 2010-08-03 DIAGNOSIS — M25579 Pain in unspecified ankle and joints of unspecified foot: Secondary | ICD-10-CM | POA: Insufficient documentation

## 2010-08-07 ENCOUNTER — Emergency Department (HOSPITAL_COMMUNITY)
Admission: EM | Admit: 2010-08-07 | Discharge: 2010-08-08 | Disposition: A | Discharge status: Other Acute Inpatient Hospital | Payer: Self-pay | Attending: Emergency Medicine | Admitting: Emergency Medicine

## 2010-08-07 DIAGNOSIS — F3289 Other specified depressive episodes: Secondary | ICD-10-CM | POA: Insufficient documentation

## 2010-08-07 DIAGNOSIS — F1410 Cocaine abuse, uncomplicated: Secondary | ICD-10-CM | POA: Insufficient documentation

## 2010-08-07 DIAGNOSIS — F489 Nonpsychotic mental disorder, unspecified: Secondary | ICD-10-CM | POA: Insufficient documentation

## 2010-08-07 DIAGNOSIS — Y92410 Unspecified street and highway as the place of occurrence of the external cause: Secondary | ICD-10-CM | POA: Insufficient documentation

## 2010-08-07 DIAGNOSIS — F1010 Alcohol abuse, uncomplicated: Secondary | ICD-10-CM | POA: Insufficient documentation

## 2010-08-07 DIAGNOSIS — F329 Major depressive disorder, single episode, unspecified: Secondary | ICD-10-CM | POA: Insufficient documentation

## 2010-08-07 DIAGNOSIS — X818XXA Intentional self-harm by jumping or lying in front of other moving object, initial encounter: Secondary | ICD-10-CM | POA: Insufficient documentation

## 2010-08-07 LAB — DIFFERENTIAL
Basophils Relative: 1 % (ref 0–1)
Eosinophils Absolute: 0.3 10*3/uL (ref 0.0–0.7)
Eosinophils Relative: 3 % (ref 0–5)
Lymphs Abs: 2.7 10*3/uL (ref 0.7–4.0)
Monocytes Relative: 7 % (ref 3–12)
Neutrophils Relative %: 65 % (ref 43–77)

## 2010-08-07 LAB — CBC
MCH: 31.4 pg (ref 26.0–34.0)
MCHC: 34.8 g/dL (ref 30.0–36.0)
MCV: 90.3 fL (ref 78.0–100.0)
Platelets: 198 10*3/uL (ref 150–400)
RDW: 14.1 % (ref 11.5–15.5)

## 2010-08-07 LAB — COMPREHENSIVE METABOLIC PANEL
ALT: 121 U/L — ABNORMAL HIGH (ref 0–53)
AST: 37 U/L (ref 0–37)
Alkaline Phosphatase: 97 U/L (ref 39–117)
CO2: 30 mEq/L (ref 19–32)
Calcium: 8.6 mg/dL (ref 8.4–10.5)
Chloride: 99 mEq/L (ref 96–112)
GFR calc Af Amer: >60 mL/min (ref >60)
GFR calc non Af Amer: >60 mL/min (ref >60)
Glucose, Bld: 109 mg/dL — ABNORMAL HIGH (ref 70–99)
Potassium: 3.6 mEq/L (ref 3.5–5.1)
Sodium: 137 mEq/L (ref 135–145)
Total Bilirubin: 0.5 mg/dL (ref 0.3–1.2)

## 2010-08-07 LAB — ETHANOL: Alcohol, Ethyl (B): 181 mg/dL — ABNORMAL HIGH (ref 0–10)

## 2010-08-07 LAB — RAPID URINE DRUG SCREEN, HOSP PERFORMED: Barbiturates: NOT DETECTED

## 2010-08-08 ENCOUNTER — Inpatient Hospital Stay (HOSPITAL_COMMUNITY)
Admission: AD | Admit: 2010-08-08 | Discharge: 2010-08-15 | DRG: 897 | Disposition: A | Discharge status: Home / Self Care | Payer: PRIVATE HEALTH INSURANCE | Source: Ambulatory Visit | Attending: Psychiatry | Admitting: Psychiatry

## 2010-08-08 DIAGNOSIS — F3289 Other specified depressive episodes: Secondary | ICD-10-CM

## 2010-08-08 DIAGNOSIS — F329 Major depressive disorder, single episode, unspecified: Secondary | ICD-10-CM

## 2010-08-08 DIAGNOSIS — Z6379 Other stressful life events affecting family and household: Secondary | ICD-10-CM

## 2010-08-08 DIAGNOSIS — F1994 Other psychoactive substance use, unspecified with psychoactive substance-induced mood disorder: Secondary | ICD-10-CM

## 2010-08-08 DIAGNOSIS — F1420 Cocaine dependence, uncomplicated: Secondary | ICD-10-CM

## 2010-08-08 DIAGNOSIS — F411 Generalized anxiety disorder: Secondary | ICD-10-CM

## 2010-08-08 DIAGNOSIS — F17200 Nicotine dependence, unspecified, uncomplicated: Secondary | ICD-10-CM

## 2010-08-08 DIAGNOSIS — F1020 Alcohol dependence, uncomplicated: Principal | ICD-10-CM

## 2010-08-08 DIAGNOSIS — N419 Inflammatory disease of prostate, unspecified: Secondary | ICD-10-CM

## 2010-08-08 DIAGNOSIS — R45851 Suicidal ideations: Secondary | ICD-10-CM

## 2010-08-09 NOTE — H&P (Signed)
NAME:  Andrew [name], Andrew [name]                ACCOUNT NO.:  [id number]  MEDICAL RECORD NO.:  [id number]           PATIENT TYPE:  I  LOCATION:  0507                          FACILITY:  [facility]  PHYSICIAN:  Franchot Gallo, MD     DATE OF BIRTH:  02/26/1971  DATE OF ADMISSION:  08/08/2010 DATE OF DISCHARGE:                      PSYCHIATRIC ADMISSION ASSESSMENT   CHIEF COMPLAINT:  "I'm depressed and was having suicidal thoughts."  HISTORY OF PRESENT ILLNESS:  The patient is a 40 year old widowed white male who presents to [facility] for evaluation and treatment of longstanding depressive symptoms with possible suicidal ideations as well as longstanding alcohol abuse.  The patient states that he has been hospitalized on numerous occasions over the last several years for his depression but never feels that his depressive symptoms are under good control.  Apparently, he reports difficulty initiating and maintaining sleep, decreased appetite, significant feelings of sadness, anhedonia and depressed mood.  He denies feelings of helplessness and hopelessness as well as some suicidal ideations.  The patient denies any thoughts of harming himself or others since admission, but states that prior to admission, he was having thoughts of "walking into traffic" to kill himself.  The patient reports that he has tried to "shoot myself" several years ago but "the gun did not go off."  He denies any other past attempts or gestures.  The patient denies any auditory or visual hallucinations or delusional thinking, nor does he report any past or current manic or hypomanic symptoms.  The patient reports that prior to admission, he was drinking 1 case of beer per day as well as occasional use of cocaine.  He also reports smoking approximately 1 pack of cigarettes per day times several years.  The patient is admitted today for evaluation and treatment of his above symptoms as well as for alcohol  detox.  PAST PSYCHIATRIC HISTORY:  The patient reports numerous past psychiatric hospitalizations.  He, however, is unable to provide information concerning any of his past admissions or use of any past psychiatric medications.  CURRENT MEDICATIONS: 1. Depakote 250 mg p.o. b.i.d. 2. Wellbutrin XL 150 p.o. q.a.m. 3. Quetiapine XR 400 mg tablets 2 tablets p.o. q.h.s.  ALLERGIES: 1. TORADOL (KETOROLAC) - Results in a rash. 2. PENICILLIN - Results in facial swelling.  PAST MEDICAL HISTORY:  No acute medical issues reported.  PAST OPERATIONS:  None reported.  FAMILY HISTORY:  The patient reports that his grandmother and grandfather on both sides of the family have a history of alcohol abuse. He also states that his father abuses alcohol.  SOCIAL HISTORY:  The patient states that he was born and raised in [place] and works for a family [employer].  The patient states that he currently lives in [place] with his fiancee.  He reports to completing the 9th grade of school and then returning to get his GED.  As stated above, the patient reports drinking 1 case of beer per day for many years as well as occasional use of cocaine.  He also reports smoking  1 pack of cigarettes per day for many years.  MENTAL STATUS EXAM:  GENERAL:  The patient was somewhat drowsy but oriented x3.  He was minimally cooperative during the evaluation, stating that he was "tired and needed to return to bed."  Speech was appropriate in rate and volume but with little spontaneous speech.  Mood appeared moderately to severely depressed.  Affect was essentially flat. THOUGHTS:  The patient denied any current auditory or visual hallucinations or delusional thinking.  He also denied any current suicidal or homicidal ideations.  Judgment and insight today both appeared fair to poor.  IMPRESSION:  Axis I: 1. Polysubstance dependence including tobacco, alcohol and cocaine. 2. Substance abuse mood  disorder, depressed type, currently under poor     control. Axis II:  Deferred. Axis III:  None reported. Axis IV:  Ongoing substance-abuse related issues. Axis V:  Global Assessment of Functioning at time of admission approximately 35.  Highest Global Assessment of Functioning in past year approximately 55.  PLAN: 1. The patient will remain on his alcohol detox protocol as prescribed     at time of his admission. 2. The patient was started on the medication Zoloft at 100 mg p.o.     q.a.m. to address his depressive symptoms. 3. The patient was continued on the medication Neurontin at 600 mg     p.o. t.i.d. which has been prescribed in the past as a mood-     stabilizing medication and to address his anxiety symptoms. 4. The patient was started on the medication Seroquel at 100 mg p.o.     q.h.s., again, which has been prescribed in the past at higher     doses for sleep and mood stabilization. 5. Potential side effects of the above medications were reviewed with     the patient.  He agreed with the medications. 6. The patient will continue to be monitored for dangerousness to self     or others as well as for substance withdrawal. 7. The patient will participate in milieu activities as ordered.    _________________________________ Franchot Gallo, MD     RR/MEDQ  D:  08/08/2010  T:  08/08/2010  Job:  [id number]  Electronically Signed by Franchot Gallo MD on 08/09/2010 04:40:45 PM

## 2010-08-11 ENCOUNTER — Ambulatory Visit (HOSPITAL_COMMUNITY)
Admission: EM | Admit: 2010-08-11 | Discharge: 2010-08-11 | Disposition: A | Discharge status: Home / Self Care | Payer: PRIVATE HEALTH INSURANCE | Source: Ambulatory Visit | Attending: Emergency Medicine | Admitting: Emergency Medicine

## 2010-08-11 DIAGNOSIS — R112 Nausea with vomiting, unspecified: Secondary | ICD-10-CM | POA: Insufficient documentation

## 2010-08-11 DIAGNOSIS — N390 Urinary tract infection, site not specified: Secondary | ICD-10-CM | POA: Insufficient documentation

## 2010-08-11 DIAGNOSIS — R310 Gross hematuria: Secondary | ICD-10-CM | POA: Insufficient documentation

## 2010-08-11 DIAGNOSIS — N419 Inflammatory disease of prostate, unspecified: Secondary | ICD-10-CM | POA: Insufficient documentation

## 2010-08-11 DIAGNOSIS — M545 Low back pain, unspecified: Secondary | ICD-10-CM | POA: Insufficient documentation

## 2010-08-11 DIAGNOSIS — Z79899 Other long term (current) drug therapy: Secondary | ICD-10-CM | POA: Insufficient documentation

## 2010-08-11 DIAGNOSIS — N509 Disorder of male genital organs, unspecified: Secondary | ICD-10-CM | POA: Insufficient documentation

## 2010-08-11 DIAGNOSIS — R300 Dysuria: Secondary | ICD-10-CM | POA: Insufficient documentation

## 2010-08-11 LAB — DIFFERENTIAL
Basophils Relative: 0 % (ref 0–1)
Eosinophils Relative: 5 % (ref 0–5)
Lymphs Abs: 2.8 10*3/uL (ref 0.7–4.0)
Monocytes Absolute: 0.8 10*3/uL (ref 0.1–1.0)

## 2010-08-11 LAB — POCT I-STAT, CHEM 8
Calcium, Ion: 1.17 mmol/L (ref 1.12–1.32)
Glucose, Bld: 108 mg/dL — ABNORMAL HIGH (ref 70–99)
HCT: 51 % (ref 39.0–52.0)
Hemoglobin: 17.3 g/dL — ABNORMAL HIGH (ref 13.0–17.0)
TCO2: 25 mmol/L (ref 0–100)

## 2010-08-11 LAB — URINALYSIS, MICROSCOPIC ONLY
Bilirubin Urine: NEGATIVE
Ketones, ur: NEGATIVE mg/dL
Specific Gravity, Urine: 1.014 (ref 1.005–1.030)
Urobilinogen, UA: 0.2 mg/dL (ref 0.0–1.0)

## 2010-08-11 LAB — URINALYSIS, ROUTINE W REFLEX MICROSCOPIC
Glucose, UA: NEGATIVE mg/dL
Protein, ur: NEGATIVE mg/dL
Specific Gravity, Urine: 1.012 (ref 1.005–1.030)
Urobilinogen, UA: 0.2 mg/dL (ref 0.0–1.0)

## 2010-08-11 LAB — CBC
HCT: 47 % (ref 39.0–52.0)
MCH: 30.5 pg (ref 26.0–34.0)
MCHC: 34 g/dL (ref 30.0–36.0)
MCV: 89.7 fL (ref 78.0–100.0)
RDW: 14.3 % (ref 11.5–15.5)

## 2010-08-11 LAB — URINE MICROSCOPIC-ADD ON

## 2010-08-12 LAB — URINE CULTURE: Culture  Setup Time: 201204221125

## 2010-08-16 NOTE — Discharge Summary (Signed)
Andrew [name], Andrew [name]                ACCOUNT NO.:  [id number]  MEDICAL RECORD NO.:  [id number]          PATIENT TYPE:  LOCATION:                                 FACILITY:  PHYSICIAN:  Franchot Gallo, MD     DATE OF BIRTH:  [birth year]  DATE OF ADMISSION:  08/07/2010 DATE OF DISCHARGE:  08/15/2010                              DISCHARGE SUMMARY   REASON FOR ADMISSION:  This is a 40 year old widowed white male who presented with longstanding depressive symptoms of possible suicidal ideation and as well as a history of alcohol abuse.  He was having difficulty with sleep, decreased appetite, feelings of sadness, anhedonia and depressed mood.  Denied any auditory or visual hallucinations.  FINAL IMPRESSION:  AXIS I:  Polysubstance dependence including tobacco, alcohol and cocaine, substance-induced mood disorder depressed type, currently under poor control. AXIS II:  Deferred. AXIS III:  None reported. AXIS IV:  Ongoing substance abuse related issues. AXIS V:  55.  SIGNIFICANT LABORATORY DATA:  Urine drug screen is negative.  Alcohol level of 161, glucose of 106.  SIGNIFICANT FINDINGS:  This was a drowsy but oriented male, minimally cooperative, reporting he was very tired.  Speech was appropriate.  His mood was moderately to severely depressed.  He was admitted to the adult milieu, started on the alcohol detox protocol and on Zoloft 100 mg to address his depressive symptoms.  We also continued with his Neurontin which was prescribed as a mood stabilizing medication and also to address and symptoms of anxiety.  We also started the patient on Seroquel which was prescribed for sleep and mood stabilization, and the patient was encouraged to participate in groups.  His sleep was minimal, his appetite was improving.  He was having severe depression with vague suicidal thoughts.  No plan or intent.  No significant withdrawal with moderate anxiety.  We increased the patient's Seroquel and  his Zoloft to aid with his symptoms.  He still was not sleeping.  His appetite was remaining fair with severe depression, tolerating his medications without any side effects, and we also modified the patient's gabapentin to help with his symptoms.  He was having some withdrawal symptoms.  The patient did have an episode of hematuria and required a transfer to the emergency department with a diagnosis of prostatitis was placed on Bactrim.  He was improving some with his depression, having only occasional vague suicidal thoughts.  He had no reported alcohol withdrawal.  We continued with his medications.  The patient's sleep was improving, appetite still fair with mild depression.  No current suicidal or homicidal thoughts.  No reports of substance withdrawal.  We increased his Seroquel.  On day of discharge, the patient's sleep was improving.  His appetite was good.  His depressive symptoms were fair to good.  No current suicidal or homicidal thoughts.  No symptoms of withdrawal and was considered stable for discharge.  The patient was discharged to substance abuse treatment at [facility].  DISCHARGE MEDICATIONS: 1. Gabapentin 600 mg one t.i.d. 2. Multivitamin one daily. 3. Quetiapine 100 mg twice a day and 300 mg taking 2 at bedtime.  4. Zoloft 50 mg taking 3 daily. 5. Thiamine one daily. 6. Bactrim to continue for symptoms of prostatitis.  FOLLOWUP:  Follow-up appointment at [facility] on Thursday 04/26 at 8:00 a.m. phone number [phone].     Landry Corporal, N.P.   ______________________________ Franchot Gallo, MD    JO/MEDQ  D:  08/15/2010  T:  08/15/2010  Job:  [id number]  Electronically Signed by Limmie PatriciaP. on 08/16/2010 09:22:31 AM Electronically Signed by Franchot Gallo MD on 08/16/2010 04:05:47 PM

## 2010-08-21 ENCOUNTER — Emergency Department (INDEPENDENT_AMBULATORY_CARE_PROVIDER_SITE_OTHER): Payer: PRIVATE HEALTH INSURANCE

## 2010-08-21 ENCOUNTER — Emergency Department (HOSPITAL_BASED_OUTPATIENT_CLINIC_OR_DEPARTMENT_OTHER)
Admission: EM | Admit: 2010-08-21 | Discharge: 2010-08-21 | Disposition: A | Discharge status: Home / Self Care | Payer: PRIVATE HEALTH INSURANCE | Attending: Emergency Medicine | Admitting: Emergency Medicine

## 2010-08-21 DIAGNOSIS — F1010 Alcohol abuse, uncomplicated: Secondary | ICD-10-CM | POA: Insufficient documentation

## 2010-08-21 DIAGNOSIS — R05 Cough: Secondary | ICD-10-CM

## 2010-08-21 DIAGNOSIS — F1910 Other psychoactive substance abuse, uncomplicated: Secondary | ICD-10-CM | POA: Insufficient documentation

## 2010-08-21 DIAGNOSIS — J40 Bronchitis, not specified as acute or chronic: Secondary | ICD-10-CM | POA: Insufficient documentation

## 2010-08-21 DIAGNOSIS — F17200 Nicotine dependence, unspecified, uncomplicated: Secondary | ICD-10-CM | POA: Insufficient documentation

## 2010-08-21 DIAGNOSIS — R059 Cough, unspecified: Secondary | ICD-10-CM

## 2010-09-02 ENCOUNTER — Emergency Department (HOSPITAL_COMMUNITY): Payer: PRIVATE HEALTH INSURANCE

## 2010-09-02 ENCOUNTER — Inpatient Hospital Stay (HOSPITAL_COMMUNITY)
Admission: EM | Admit: 2010-09-02 | Discharge: 2010-09-07 | DRG: 192 | Disposition: A | Discharge status: Home / Self Care | Payer: PRIVATE HEALTH INSURANCE | Attending: Family Medicine | Admitting: Family Medicine

## 2010-09-02 DIAGNOSIS — F3289 Other specified depressive episodes: Secondary | ICD-10-CM | POA: Diagnosis present

## 2010-09-02 DIAGNOSIS — F4310 Post-traumatic stress disorder, unspecified: Secondary | ICD-10-CM

## 2010-09-02 DIAGNOSIS — T380X5A Adverse effect of glucocorticoids and synthetic analogues, initial encounter: Secondary | ICD-10-CM | POA: Diagnosis not present

## 2010-09-02 DIAGNOSIS — I2510 Atherosclerotic heart disease of native coronary artery without angina pectoris: Secondary | ICD-10-CM | POA: Diagnosis present

## 2010-09-02 DIAGNOSIS — Z880 Allergy status to penicillin: Secondary | ICD-10-CM

## 2010-09-02 DIAGNOSIS — D72829 Elevated white blood cell count, unspecified: Secondary | ICD-10-CM | POA: Diagnosis not present

## 2010-09-02 DIAGNOSIS — F1910 Other psychoactive substance abuse, uncomplicated: Secondary | ICD-10-CM | POA: Diagnosis present

## 2010-09-02 DIAGNOSIS — K219 Gastro-esophageal reflux disease without esophagitis: Secondary | ICD-10-CM | POA: Diagnosis not present

## 2010-09-02 DIAGNOSIS — J441 Chronic obstructive pulmonary disease with (acute) exacerbation: Principal | ICD-10-CM | POA: Diagnosis present

## 2010-09-02 DIAGNOSIS — F17200 Nicotine dependence, unspecified, uncomplicated: Secondary | ICD-10-CM | POA: Diagnosis present

## 2010-09-02 DIAGNOSIS — F329 Major depressive disorder, single episode, unspecified: Secondary | ICD-10-CM | POA: Diagnosis present

## 2010-09-02 DIAGNOSIS — F1010 Alcohol abuse, uncomplicated: Secondary | ICD-10-CM | POA: Diagnosis present

## 2010-09-02 DIAGNOSIS — I10 Essential (primary) hypertension: Secondary | ICD-10-CM | POA: Diagnosis present

## 2010-09-02 DIAGNOSIS — R7402 Elevation of levels of lactic acid dehydrogenase (LDH): Secondary | ICD-10-CM | POA: Diagnosis present

## 2010-09-02 DIAGNOSIS — F1120 Opioid dependence, uncomplicated: Secondary | ICD-10-CM | POA: Diagnosis present

## 2010-09-02 DIAGNOSIS — R7401 Elevation of levels of liver transaminase levels: Secondary | ICD-10-CM | POA: Diagnosis present

## 2010-09-02 DIAGNOSIS — R0902 Hypoxemia: Secondary | ICD-10-CM | POA: Diagnosis present

## 2010-09-02 LAB — ETHANOL: Alcohol, Ethyl (B): 69 mg/dL — ABNORMAL HIGH (ref 0–10)

## 2010-09-02 LAB — BLOOD GAS, ARTERIAL
Acid-Base Excess: 1 mmol/L (ref 0.0–2.0)
Drawn by: 30996
O2 Content: 5 L/min
pCO2 arterial: 47.6 mmHg — ABNORMAL HIGH (ref 35.0–45.0)
pH, Arterial: 7.365 (ref 7.350–7.450)
pO2, Arterial: 59.5 mmHg — ABNORMAL LOW (ref 80.0–100.0)

## 2010-09-02 LAB — CBC
HCT: 45.9 % (ref 39.0–52.0)
MCH: 30 pg (ref 26.0–34.0)
MCV: 89.5 fL (ref 78.0–100.0)
Platelets: 202 10*3/uL (ref 150–400)
RBC: 5.13 MIL/uL (ref 4.22–5.81)
RDW: 14.5 % (ref 11.5–15.5)

## 2010-09-02 LAB — COMPREHENSIVE METABOLIC PANEL
ALT: 63 U/L — ABNORMAL HIGH (ref 0–53)
Alkaline Phosphatase: 75 U/L (ref 39–117)
BUN: 6 mg/dL (ref 6–23)
CO2: 22 mEq/L (ref 19–32)
GFR calc non Af Amer: >60 mL/min (ref >60)
Glucose, Bld: 102 mg/dL — ABNORMAL HIGH (ref 70–99)
Potassium: 3.8 mEq/L (ref 3.5–5.1)
Total Protein: 7.2 g/dL (ref 6.0–8.3)

## 2010-09-02 LAB — DIFFERENTIAL
Eosinophils Absolute: 0.3 10*3/uL (ref 0.0–0.7)
Eosinophils Relative: 3 % (ref 0–5)
Lymphocytes Relative: 25 % (ref 12–46)
Lymphs Abs: 2.2 10*3/uL (ref 0.7–4.0)
Monocytes Relative: 8 % (ref 3–12)

## 2010-09-02 LAB — RAPID URINE DRUG SCREEN, HOSP PERFORMED
Cocaine: NOT DETECTED
Tetrahydrocannabinol: NOT DETECTED

## 2010-09-02 LAB — SALICYLATE LEVEL: Salicylate Lvl: <2 mg/dL — ABNORMAL LOW (ref 2.8–20.0)

## 2010-09-03 ENCOUNTER — Inpatient Hospital Stay (HOSPITAL_COMMUNITY): Payer: PRIVATE HEALTH INSURANCE

## 2010-09-03 LAB — BASIC METABOLIC PANEL
BUN: 11 mg/dL (ref 6–23)
Chloride: 101 mEq/L (ref 96–112)
GFR calc Af Amer: >60 mL/min (ref >60)
GFR calc non Af Amer: >60 mL/min (ref >60)
Potassium: 4.9 mEq/L (ref 3.5–5.1)
Sodium: 137 mEq/L (ref 135–145)

## 2010-09-03 LAB — CBC
MCV: 90.5 fL (ref 78.0–100.0)
Platelets: 205 10*3/uL (ref 150–400)
RBC: 5.17 MIL/uL (ref 4.22–5.81)
RDW: 14.8 % (ref 11.5–15.5)
WBC: 14.8 10*3/uL — ABNORMAL HIGH (ref 4.0–10.5)

## 2010-09-04 DIAGNOSIS — F339 Major depressive disorder, recurrent, unspecified: Secondary | ICD-10-CM

## 2010-09-04 LAB — CBC
HCT: 47 % (ref 39.0–52.0)
MCV: 90.9 fL (ref 78.0–100.0)
Platelets: 214 10*3/uL (ref 150–400)
RBC: 5.17 MIL/uL (ref 4.22–5.81)
RDW: 14.9 % (ref 11.5–15.5)
WBC: 18.5 10*3/uL — ABNORMAL HIGH (ref 4.0–10.5)

## 2010-09-05 ENCOUNTER — Inpatient Hospital Stay (HOSPITAL_COMMUNITY): Payer: PRIVATE HEALTH INSURANCE

## 2010-09-05 LAB — COMPREHENSIVE METABOLIC PANEL
ALT: 30 U/L (ref 0–53)
Alkaline Phosphatase: 58 U/L (ref 39–117)
BUN: 10 mg/dL (ref 6–23)
CO2: 25 mEq/L (ref 19–32)
GFR calc non Af Amer: >60 mL/min (ref >60)
Glucose, Bld: 155 mg/dL — ABNORMAL HIGH (ref 70–99)
Potassium: 3.9 mEq/L (ref 3.5–5.1)
Sodium: 138 mEq/L (ref 135–145)
Total Bilirubin: 0.3 mg/dL (ref 0.3–1.2)

## 2010-09-05 LAB — CBC
HCT: 44.8 % (ref 39.0–52.0)
Hemoglobin: 14.6 g/dL (ref 13.0–17.0)
MCV: 90.9 fL (ref 78.0–100.0)
WBC: 15.1 10*3/uL — ABNORMAL HIGH (ref 4.0–10.5)

## 2010-09-05 LAB — DIFFERENTIAL
Basophils Absolute: 0 10*3/uL (ref 0.0–0.1)
Lymphocytes Relative: 3 % — ABNORMAL LOW (ref 12–46)
Lymphs Abs: 0.5 10*3/uL — ABNORMAL LOW (ref 0.7–4.0)
Monocytes Absolute: 0.5 10*3/uL (ref 0.1–1.0)
Neutro Abs: 14.2 10*3/uL — ABNORMAL HIGH (ref 1.7–7.7)

## 2010-09-05 LAB — MAGNESIUM: Magnesium: 2.1 mg/dL (ref 1.5–2.5)

## 2010-09-06 ENCOUNTER — Inpatient Hospital Stay (HOSPITAL_COMMUNITY): Payer: PRIVATE HEALTH INSURANCE

## 2010-09-06 LAB — DIFFERENTIAL
Basophils Absolute: 0 10*3/uL (ref 0.0–0.1)
Basophils Relative: 0 % (ref 0–1)
Eosinophils Absolute: 0 10*3/uL (ref 0.0–0.7)
Eosinophils Relative: 0 % (ref 0–5)

## 2010-09-06 LAB — CBC
MCV: 90 fL (ref 78.0–100.0)
Platelets: 202 10*3/uL (ref 150–400)
RDW: 14.9 % (ref 11.5–15.5)
WBC: 12 10*3/uL — ABNORMAL HIGH (ref 4.0–10.5)

## 2010-09-06 LAB — COMPREHENSIVE METABOLIC PANEL
Albumin: 3.4 g/dL — ABNORMAL LOW (ref 3.5–5.2)
Alkaline Phosphatase: 57 U/L (ref 39–117)
BUN: 11 mg/dL (ref 6–23)
GFR calc Af Amer: >60 mL/min (ref >60)
Potassium: 4.1 mEq/L (ref 3.5–5.1)
Sodium: 135 mEq/L (ref 135–145)
Total Protein: 6.3 g/dL (ref 6.0–8.3)

## 2010-09-06 LAB — MAGNESIUM: Magnesium: 2.3 mg/dL (ref 1.5–2.5)

## 2010-09-06 NOTE — Cardiovascular Report (Signed)
NAME:  Andrew [name], Andrew [name]                          ACCOUNT NO.:  [id number]   MEDICAL RECORD NO.:  [id number]                   PATIENT TYPE:  INP   LOCATION:  4707                                 FACILITY:  [facility]   PHYSICIAN:  Carole Binning, M.D. [facility]         DATE OF BIRTH:  November 25, 1970   DATE OF PROCEDURE:  11/21/2002  DATE OF DISCHARGE:                              CARDIAC CATHETERIZATION   PROCEDURE PERFORMED:  Left heart catheterization with coronary angiography  and left ventriculography.   INDICATIONS:  Andrew [name] is a 40 year old male admitted to [facility] with chest pain.  A stress imaging study suggested possible  inferior ischemia.  He was referred for cardiac catheterization.   PROCEDURE:  A 6-French sheath was placed in the right femoral artery.  Coronary angiography was performed with 6-French JL4 and JR4 catheters.  Left ventriculography was performed with an angled pigtail catheter.  Contrast was Omnipaque.  There were no complications.   RESULTS:   HEMODYNAMICS:  Left ventricular pressure 112/20.  Aortic pressure 112/80.  There is no aortic valve gradient.   LEFT VENTRICULOGRAM:  Wall motion is normal.  Ejection fraction estimated  greater than 60%.  There is trace mitral regurgitation.   CORONARY ARTERIOGRAPHY:  (Right dominant)   Left main has a tubular 30% stenosis proximally.   Left anterior descending has a 20% stenosis at its origin.  The mid LAD has  a 30% stenosis.  The LAD gives rise to a small single diagonal branch and  the distal LAD is a relatively small vessel.   Left circumflex gives rise to a small to normal sized ramus intermedius and  a small obtuse marginal.  The left circumflex has only minor luminal  irregularities.   Right coronary artery is a large, dominant vessel.  There is a tubular 30%  stenosis at its origin.  The distal right coronary artery has diffuse  luminal irregularities.  The distal right coronary  artery gives rise to a  large posterior descending artery, small first and second posterolateral  branches, and a normal sized third posterolateral branch.  There are diffuse  luminal irregularities in the third posterolateral branch.    IMPRESSION:  1. Normal left ventricular systolic function.  2. Mild coronary artery disease as described, but no hemodynamically     significant stenoses identified.   PLAN:  The patient will be managed medically.                                               Carole Binning, M.D. [facility]    MWP/MEDQ  D:  11/21/2002  T:  11/21/2002  Job:  [id number]   cc:   [facility]  [address] [place]  [place] [address]  Fax: [phone]

## 2010-09-06 NOTE — Discharge Summary (Signed)
NAME:  Andrew [name], Andrew [name]                          ACCOUNT NO.:  [id number]   MEDICAL RECORD NO.:  [id number]                   PATIENT TYPE:  INP   LOCATION:  4707                                 FACILITY:  [facility]   PHYSICIAN:  Jonelle Sidle, M.D. [facility]        DATE OF BIRTH:  Apr 20, 1971   DATE OF ADMISSION:  11/17/2002  DATE OF DISCHARGE:  11/21/2002                           DISCHARGE SUMMARY - REFERRING   PROCEDURES:  Cardiac catheterization August 2.   REASON FOR ADMISSION:  [name] is a 40 year old male, with no previous  cardiac history, and cardiac risk factors notable for tobacco smoking, who  initially presented to [facility] for evaluation of chest discomfort  and dyspnea.  He was referred to Dr. Simona Huh and subsequent workup  consisted of non-invasive testing.  Serial cardiac markers were normal.  Lipid profile was notable for marked dyslipidemia with a total cholesterol  of 315, triglycerides 570, and HDL level 39.  The patient underwent a stress  test which was interpreted as abnormal and the patient was thus referred for  a diagnostic coronary angiography.   Following transfer, the patient was maintained on a medication regimen  including aspirin, Lopressor, Altace, and Lovenox.   Cardiac catheterization was performed on August 2 by Dr. Loraine Leriche Pulsipher (see  report for full details).  It revealed no significant coronary artery  disease with normal left ventricular function and no evidence of wall motion  abnormalities.   Dr. Gerri Spore recommended treatment with a proton pump inhibitor and  aggressive treatment of dyslipidemia.  The patient was also referred for a  smoking cessation consultation and was seen prior to discharge.   DISCHARGE MEDICATIONS:  1. Protonix 40 mg daily.  2. Aspirin 81 mg daily.  3. Zocor 40 mg q.h.s.  4. Altace 5 mg daily.  5. Ambien 5 mg q.h.s. p.r.n.   INSTRUCTIONS:  No heavy lifting/driving x2 days; maintain  low-fat/low-  cholesterol diet; call the office if there is any swelling/bleeding of the  groin.  The patient is strongly urged to stop smoking.  The patient is  scheduled to return to the cardiology office in [facility] for a followup groin  check on August 9 at 2:30 p.m.   DISCHARGE DIAGNOSES:  1. Mild cardiac chest pain.     a. Normal serial cardiac enzymes.     b. Non-obstructive coronary artery disease/normal left ventricle--        coronary angiogram August 2.  2.     Dyslipidemia.  3. Hypertension.  4. Tobacco.      Gene Serpe, P.A. [facility]                      Jonelle Sidle, M.D. [facility]    GS/MEDQ  D:  11/21/2002  T:  11/21/2002  Job:  [id number]   cc:   [facility]  [address]  [address]  [place], [place]  [address]  

## 2010-09-07 LAB — COMPREHENSIVE METABOLIC PANEL
Alkaline Phosphatase: 56 U/L (ref 39–117)
BUN: 18 mg/dL (ref 6–23)
CO2: 25 mEq/L (ref 19–32)
Chloride: 98 mEq/L (ref 96–112)
Creatinine, Ser: 0.85 mg/dL (ref 0.4–1.5)
GFR calc non Af Amer: >60 mL/min (ref >60)
Glucose, Bld: 167 mg/dL — ABNORMAL HIGH (ref 70–99)
Potassium: 3.5 mEq/L (ref 3.5–5.1)
Total Bilirubin: 0.4 mg/dL (ref 0.3–1.2)

## 2010-09-07 LAB — DIFFERENTIAL
Eosinophils Absolute: 0 10*3/uL (ref 0.0–0.7)
Lymphocytes Relative: 5 % — ABNORMAL LOW (ref 12–46)
Lymphs Abs: 0.6 10*3/uL — ABNORMAL LOW (ref 0.7–4.0)
Monocytes Relative: 2 % — ABNORMAL LOW (ref 3–12)
Neutrophils Relative %: 93 % — ABNORMAL HIGH (ref 43–77)

## 2010-09-07 LAB — CBC
HCT: 45.4 % (ref 39.0–52.0)
Hemoglobin: 15.2 g/dL (ref 13.0–17.0)
MCH: 29.8 pg (ref 26.0–34.0)
MCV: 89 fL (ref 78.0–100.0)
Platelets: 200 10*3/uL (ref 150–400)
RBC: 5.1 MIL/uL (ref 4.22–5.81)
WBC: 12.3 10*3/uL — ABNORMAL HIGH (ref 4.0–10.5)

## 2010-09-07 LAB — MAGNESIUM: Magnesium: 2.6 mg/dL — ABNORMAL HIGH (ref 1.5–2.5)

## 2010-09-08 NOTE — Group Therapy Note (Signed)
  NAME:  Andrew [name], Andrew [name]                ACCOUNT NO.:  [id number]  MEDICAL RECORD NO.:  [id number]           PATIENT TYPE:  I  LOCATION:  1513                         FACILITY:  [facility]  PHYSICIAN:  Eulogio Ditch, MD DATE OF BIRTH:  03-15-1971                                PROGRESS NOTE   PROGRESS NOTE: This is a 40 year old white male who has been admitted to behavioral health in the ER multiple times for chronic alcohol abuse along with abuse of cocaine and marijuana.  Today, the patient is very logical and goal-directed.  Denies any suicidal or homicidal ideations.  He is not delusional, alert, awake, and oriented x3.  Insight and judgment fair.  The patient was seen with the clinical social worker.  The patient agreed to follow up in the outpatient setting at [facility].  Once the patient is medically cleared, the patient can be discharged to follow up at [facility].  The patient was recently admitted on behavioral health and discharged on August 15, 2010, with a followup at [facility].  The patient can be continued on his current psych medications, 1. Seroquel 100 mg twice a day along with 300 mg 2 tablets at bedtime. 2. Zoloft 150 mg p.o. daily. 3. Gabapentin 600 mg 3 times a day.     Eulogio Ditch, MD     SA/MEDQ  D:  09/08/2010  T:  09/08/2010  Job:  [id number]  Electronically Signed by Eulogio Ditch  on 09/08/2010 12:20:40 PM

## 2010-09-08 NOTE — Discharge Summary (Signed)
Andrew [name], [name]                ACCOUNT NO.:  [id number]  MEDICAL RECORD NO.:  [id number]           PATIENT TYPE:  I  LOCATION:  1513                         FACILITY:  [facility]  PHYSICIAN:  Andrew Nap, MD  DATE OF BIRTH:  01-18-1971  DATE OF ADMISSION:  09/02/2010 DATE OF DISCHARGE:  09/07/2010                        DISCHARGE SUMMARY - REFERRING   PRIMARY CARE PHYSICIAN:  Unknown.  ADMITTING PHYSICIAN:  Andrew Cove, MD  DISCHARGE DIAGNOSES: 1. Chronic obstructive pulmonary disease exacerbation. 2. Chronic tobacco use. 3. Polysubstance abuse. 4. Hypertension. 5. Depression. 6. Gastroesophageal reflux disease. 7. History of nonobstructive coronary artery disease, status post     cardiac cath. 8. Abnormal liver function tests, most likely secondary to chronic     alcohol use.  CONSULTANTS:  Psychiatry, Dr. Eulogio [name].  BRIEF HISTORY AND PHYSICAL:  The patient is a 40 year old Caucasian male with history of COPD, polysubstance abuse, and cardiac cath for prior nonobstructive coronary artery disease, who was admitted to the hospital on Sep 02, 2010, by Dr. Zannie [name] because the patient requested to be detoxified.  The patient also was said to have requested for help in order for him to turn his life around.  He claimed he was tired of abusing prescription medications and wanted to change.  In the ED, the patient was found to be wheezing and coughing.  He said the cough had been on and off for about 2 weeks with associated congestion.  He, however, denied any systemic symptoms, i.e., no chills, no rigor.  He denied any nausea or vomiting.  In addition, the patient complained about chest tightness associated with wheezing, but denied any shortness of breath, no PND or orthopnea.  At the time he was evaluated in the ED, he was found to have widespread rhonchi and pulse ox was 90%, and subsequently admitted for stabilization.  PREADMISSION MEDICATIONS:   Without dosages include Zoloft, Neurontin, trazodone, albuterol.  ALLERGIES:  PENICILLIN and TORADOL.  PAST SURGICAL HISTORY:  Nonobstructive coronary artery disease, status post CABG.  Findings unknown.  SOCIAL HISTORY:  Positive for chronic tobacco and alcohol use.  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  FAMILY HISTORY:  Andrew [name] to be positive for cancer of unknown origin, diabetes mellitus and hypertension.  REVIEW OF SYSTEMS:  Essentially as documented in the initial history and physical.  At the time the patient was seen by the admitting physician: Vital Signs: Temperature was 98.2, pulse 102, blood pressure 120/60, respiratory rate 18, and he was saturating 92% on O2 via nasal cannula 4 L per minute.  He was not in any distress.  HEENT:  Pupils were reactive to light and extraocular muscles intact.  Neck:  No jugular venous distention.  No carotid bruit.  No lymphadenopathy.  Chest:  Decreased breath sounds in the mid and upper lobes of the lungs with scattered crackles.  Heart:  Sounds are 1 and 2.  Abdomen:  Soft, nontender. Liver, spleen, kidney not palpable.  Bowel sounds are positive. Extremities:  No pedal edema.  Neurologic:  Exam nonfocal. Musculoskeletal:  Unremarkable.  Skin:  Multiple tattoos.  LABORATORY AND RADIOLOGIC DATA:  Initial complete blood count with differential showed WBC of 8.9, hemoglobin of 15.4, hematocrit of 45.9, MCV of 89.5 with a platelet count of 202, normal differential. Acetaminophen level was less than 15.0.  Comprehensive metabolic panel showed sodium of 138, potassium of 3.8, chloride of 101 with a bicarb of 22, glucose is 102, BUN is 6, creatinine 0.82.  LFT, total bilirubin 0.2, alkaline phosphatase is 75, AST 67, ALT 63.  Alcohol level 69, elevated.  Salicylate level less than 2.0.  Urine drug screen was positive for amphetamine, benzodiazepine, and opiates.  Arterial blood gases done showed pH of 7.36, pCO2  of 47.6, pO2 59.5 with a bicarb of 26.5.  A repeat complete blood count with no differential done on Sep 03, 2010, showed WBC of 14.8, hemoglobin of 15.4, hematocrit of 46.8, MCV of 90.5, with a platelet count of 205.  And basic metabolic panel showed sodium of 137, potassium of 4.9, chloride of 101 with a bicarb of 26, glucose is 201, BUN is 11, creatinine 0.79, magnesium level is 2.1. A repeat magnesium level done on Sep 07, 2010, showed magnesium of 2.6. Comprehensive metabolic panel showed sodium of 134, potassium of 3.5, chloride of 98 with a bicarb of 25, glucose is 167, BUN is 18, creatinine 0.85.  LFTs, total bilirubin 0.4, alkaline phosphatase 56, AST 14, ALT 31; all normal.  And complete blood count with differential showed WBC of 12.3, hemoglobin of 15.2, hematocrit of 45.4, MCV of 89.0 with a platelet count of 200, neutrophils 93%.  Initial chest x-ray done on admission showed low lung volumes with bibasilar atelectasis.  A repeat chest x-ray done on Sep 03, 2010, showed increased density in the right middle lobe most compatible with atelectasis with some focal pneumonia possibly, given the history of fever.  A chest x-ray done on Sep 05, 2010, showed bibasilar hyperinflation with mild bibasilar atelectases and minimal bronchitic changes.  A chest x-ray done on Sep 06, 2010, shows stable chronic type lung changes.  No definite infiltrates seen.  HOSPITAL COURSE:  The patient was admitted to the regular floor.  He was started on normal saline to go at rate of 50 mL/hour, Lovenox 40 mg subcu q.24 for DVT prophylaxis.  He was also given Zofran for nausea. The patient's fever was said to have been controlled with Tylenol 650 mg p.o. q.4 p.r.n.  He was started on Avelox 400 mg IV q.24, given breathing treatment, albuterol nebs q.12 h p.r.n. and then Solu-Medrol 150 mg IV x1 dose.  Also added to the patient's regimen was Xopenex 0.6 mg nebs q.2 p.r.n.  Added to the patient's  regimen was thiamine 100 mg IV daily, multivitamin 1 p.o. daily, and subsequently placed on CIWA protocol.  The patient was placed on one-to-one sitter for suicide precautions.  Other medications given to the patient include Mucinex 600 mg p.o. daily.  Since the patient expresses willingness to quit smoking, he was given nicotine patch 40 mg transdermal q.24 h.  On Sep 03, 2010, the patient's dose of Solu-Medrol was decreased to 80 mg IV q.6 hourly and his IV fluid was also reduced to normal saline to go at rate of 75 mL/hour, also added to regimen was Klonopin 1 mg p.o. b.i.d.  The patient was seen by me for the very first time in this index admission on 09/04/2010 and during this encounter the patient continued to complain about cough that is occasionally productive of sputum and subsequently the patient's  breathing treatment were revised and he was given albuterol and Atrovent nebs q.4 h.  He was also given antitussive which includes Robitussin with codeine 15 mL p.o. t.i.d.  Followup chest x-ray were ordered.  That patient continued to have episodic blood pressure change and he was given clonidine 0.1 mg p.o. b.i.d.  He was also treated for questionable GERD with Protonix 40 mg p.o. daily and Carafate 1 g p.o. t.i.d. with meals, and given breathing treatment.  The patient had a chest physical therapy done.  On daily basis, he was evaluated and made remarkable progress, however, on 09/06/2010, examination of the patient when he was evaluated by me, showed rales in addition to rhonchi and subsequently the patient's IV fluid was discontinued.  He was given Lasix 40 mg IV stat.  The patient had been followed and evaluated by me from May 16 to May 19 and all through these encounters made progress every day.  He was seen by me today, feels better.  Denies any chest pain or shortness of breath.  He was ambulated without oxygen and pulse ox was more than 95%.  He was also evaluated by the  in-house psychiatrist, Dr. Rogers Blocker, who made an impression of polysubstance abuse, added Ativan to the patient's regimen, and following discharge the patient was to go back to continue his rehab at the [facility].  The patient was seen by me today, feels better.  Examination unremarkable, i.e., chest was clear to auscultation.  Vital Signs: Blood pressure is 150/93, temperature 98, pulse 97, respiratory rate 20, medically stable.  DISCHARGE PLAN: 1. Patient to be discharged home today and thereafter to report to     [facility] drug [facility] rehab center for rehab and detoxification. 2. Diet will include cardiac prudent. 3. He is to be followed up by his primary care physician in 1 to 2     weeks.  MEDICATIONS TO BE TAKEN AT HOME: 1. Klonopin 1 mg p.o. b.i.d. 2. Clonidine 0.1 mg p.o. b.i.d. 3. Combivent inhaler 2 puffs 3 times p.r.n. 4. Folic acid 1 mg p.o. daily. 5. Robitussin with codeine 10 mL p.o. t.i.d. p.r.n. 6. Avelox 400 mg 1 p.o. daily for 7 days. 7. Multivitamin therapeutics with mineral 1 p.o. daily. 8. Nicotine transdermal patch 40 mg q.24 h daily. 9. Pantoprazole 40 mg 1 p.o. daily. 10.Carafate 1 g p.o. t.i.d. with meals. 11.Thiamine 100 mg 1 p.o. daily. 12.Acetaminophen 325 mg 2 tablets p.o. q.6 p.r.n. 13.Gabapentin (Neurontin) 600 mg 1 capsule p.o. t.i.d. 14.Sertraline (Zoloft) 50 mg 3 tablets p.o. daily.     Andrew Nap, MD     CN/MEDQ  D:  09/07/2010  T:  09/07/2010  Job:  [id number]  Electronically Signed by Andrew [name]  on 09/08/2010 07:01:47 AM

## 2010-09-15 ENCOUNTER — Encounter: Payer: Self-pay | Admitting: Ophthalmology

## 2010-09-15 ENCOUNTER — Inpatient Hospital Stay (HOSPITAL_COMMUNITY)
Admission: EM | Admit: 2010-09-15 | Discharge: 2010-09-19 | DRG: 191 | Disposition: A | Discharge status: Home / Self Care | Payer: Self-pay | Attending: Internal Medicine | Admitting: Internal Medicine

## 2010-09-15 ENCOUNTER — Emergency Department (HOSPITAL_COMMUNITY): Payer: Self-pay

## 2010-09-15 DIAGNOSIS — I2510 Atherosclerotic heart disease of native coronary artery without angina pectoris: Secondary | ICD-10-CM | POA: Diagnosis present

## 2010-09-15 DIAGNOSIS — G8929 Other chronic pain: Secondary | ICD-10-CM | POA: Diagnosis present

## 2010-09-15 DIAGNOSIS — F1020 Alcohol dependence, uncomplicated: Secondary | ICD-10-CM | POA: Diagnosis present

## 2010-09-15 DIAGNOSIS — J441 Chronic obstructive pulmonary disease with (acute) exacerbation: Principal | ICD-10-CM | POA: Diagnosis present

## 2010-09-15 DIAGNOSIS — F1210 Cannabis abuse, uncomplicated: Secondary | ICD-10-CM | POA: Diagnosis present

## 2010-09-15 DIAGNOSIS — F1110 Opioid abuse, uncomplicated: Secondary | ICD-10-CM | POA: Diagnosis present

## 2010-09-15 DIAGNOSIS — Z79899 Other long term (current) drug therapy: Secondary | ICD-10-CM

## 2010-09-15 DIAGNOSIS — IMO0002 Reserved for concepts with insufficient information to code with codable children: Secondary | ICD-10-CM

## 2010-09-15 DIAGNOSIS — K208 Other esophagitis without bleeding: Secondary | ICD-10-CM | POA: Diagnosis present

## 2010-09-15 DIAGNOSIS — Z880 Allergy status to penicillin: Secondary | ICD-10-CM

## 2010-09-15 DIAGNOSIS — F17200 Nicotine dependence, unspecified, uncomplicated: Secondary | ICD-10-CM | POA: Diagnosis present

## 2010-09-15 DIAGNOSIS — M6282 Rhabdomyolysis: Secondary | ICD-10-CM | POA: Diagnosis present

## 2010-09-15 DIAGNOSIS — Z8249 Family history of ischemic heart disease and other diseases of the circulatory system: Secondary | ICD-10-CM

## 2010-09-15 DIAGNOSIS — F1410 Cocaine abuse, uncomplicated: Secondary | ICD-10-CM | POA: Diagnosis present

## 2010-09-15 DIAGNOSIS — G4733 Obstructive sleep apnea (adult) (pediatric): Secondary | ICD-10-CM | POA: Diagnosis present

## 2010-09-15 DIAGNOSIS — R319 Hematuria, unspecified: Secondary | ICD-10-CM | POA: Diagnosis present

## 2010-09-15 LAB — CBC
Hemoglobin: 15.7 g/dL (ref 13.0–17.0)
MCH: 31 pg (ref 26.0–34.0)
MCHC: 34.6 g/dL (ref 30.0–36.0)
Platelets: 227 10*3/uL (ref 150–400)
RBC: 5.07 MIL/uL (ref 4.22–5.81)

## 2010-09-15 LAB — DIFFERENTIAL
Basophils Absolute: 0 10*3/uL (ref 0.0–0.1)
Basophils Relative: 0 % (ref 0–1)
Eosinophils Absolute: 0.3 10*3/uL (ref 0.0–0.7)
Lymphocytes Relative: 29 % (ref 12–46)
Monocytes Absolute: 1.5 10*3/uL — ABNORMAL HIGH (ref 0.1–1.0)
Neutrophils Relative %: 60 % (ref 43–77)

## 2010-09-15 LAB — TROPONIN I: Troponin I: <0.3 ng/mL (ref <0.30)

## 2010-09-15 LAB — POCT I-STAT 3, ART BLOOD GAS (G3+)
Patient temperature: 98.2
TCO2: 25 mmol/L (ref 0–100)
pCO2 arterial: 40 mmHg (ref 35.0–45.0)
pH, Arterial: 7.386 (ref 7.350–7.450)

## 2010-09-15 LAB — BASIC METABOLIC PANEL
BUN: 11 mg/dL (ref 6–23)
Creatinine, Ser: 1.14 mg/dL (ref 0.4–1.5)
GFR calc Af Amer: >60 mL/min (ref >60)
GFR calc non Af Amer: >60 mL/min (ref >60)
Potassium: 3.6 mEq/L (ref 3.5–5.1)

## 2010-09-15 LAB — CK TOTAL AND CKMB (NOT AT ARMC)
CK, MB: 9 ng/mL (ref 0.3–4.0)
Total CK: 721 U/L — ABNORMAL HIGH (ref 7–232)

## 2010-09-15 LAB — GLUCOSE, CAPILLARY: Glucose-Capillary: 167 mg/dL — ABNORMAL HIGH (ref 70–99)

## 2010-09-15 LAB — CARDIAC PANEL(CRET KIN+CKTOT+MB+TROPI)
CK, MB: 6.9 ng/mL (ref 0.3–4.0)
Relative Index: 2.2 (ref 0.0–2.5)
Troponin I: <0.3 ng/mL (ref <0.30)

## 2010-09-15 LAB — RAPID URINE DRUG SCREEN, HOSP PERFORMED
Amphetamines: NOT DETECTED
Benzodiazepines: POSITIVE — AB
Tetrahydrocannabinol: NOT DETECTED

## 2010-09-15 LAB — ETHANOL: Alcohol, Ethyl (B): 217 mg/dL — ABNORMAL HIGH (ref 0–10)

## 2010-09-16 LAB — BASIC METABOLIC PANEL
CO2: 20 mEq/L (ref 19–32)
Calcium: 8.8 mg/dL (ref 8.4–10.5)
Chloride: 104 mEq/L (ref 96–112)
GFR calc Af Amer: >60 mL/min (ref >60)
Potassium: 3.9 mEq/L (ref 3.5–5.1)
Sodium: 138 mEq/L (ref 135–145)

## 2010-09-16 LAB — CBC
Hemoglobin: 13.4 g/dL (ref 13.0–17.0)
MCHC: 33.2 g/dL (ref 30.0–36.0)
RBC: 4.46 MIL/uL (ref 4.22–5.81)
WBC: 15.3 10*3/uL — ABNORMAL HIGH (ref 4.0–10.5)

## 2010-09-17 ENCOUNTER — Inpatient Hospital Stay (HOSPITAL_COMMUNITY): Payer: Self-pay

## 2010-09-17 LAB — URINALYSIS, ROUTINE W REFLEX MICROSCOPIC
Specific Gravity, Urine: 1.013 (ref 1.005–1.030)
Urobilinogen, UA: 0.2 mg/dL (ref 0.0–1.0)
pH: 7 (ref 5.0–8.0)

## 2010-09-17 LAB — URINE CULTURE
Colony Count: NO GROWTH
Culture: NO GROWTH

## 2010-09-17 LAB — URINE MICROSCOPIC-ADD ON

## 2010-09-18 LAB — GC/CHLAMYDIA PROBE AMP, URINE
Chlamydia, Swab/Urine, PCR: NEGATIVE
GC Probe Amp, Urine: NEGATIVE

## 2010-09-18 LAB — D-DIMER, QUANTITATIVE: D-Dimer, Quant: 0.45 ug/mL-FEU (ref 0.00–0.48)

## 2010-09-19 LAB — COMPREHENSIVE METABOLIC PANEL
ALT: 77 U/L — ABNORMAL HIGH (ref 0–53)
Albumin: 3.1 g/dL — ABNORMAL LOW (ref 3.5–5.2)
Alkaline Phosphatase: 57 U/L (ref 39–117)
BUN: 11 mg/dL (ref 6–23)
Calcium: 8.8 mg/dL (ref 8.4–10.5)
Potassium: 3.1 mEq/L — ABNORMAL LOW (ref 3.5–5.1)
Sodium: 141 mEq/L (ref 135–145)
Total Protein: 5.8 g/dL — ABNORMAL LOW (ref 6.0–8.3)

## 2010-09-19 LAB — PHOSPHORUS: Phosphorus: 3.1 mg/dL (ref 2.3–4.6)

## 2010-09-19 LAB — CBC
MCHC: 33.3 g/dL (ref 30.0–36.0)
Platelets: 164 10*3/uL (ref 150–400)
RDW: 15.3 % (ref 11.5–15.5)
WBC: 11.4 10*3/uL — ABNORMAL HIGH (ref 4.0–10.5)

## 2010-09-19 LAB — DIFFERENTIAL
Basophils Absolute: 0 10*3/uL (ref 0.0–0.1)
Eosinophils Absolute: 0 10*3/uL (ref 0.0–0.7)
Eosinophils Relative: 0 % (ref 0–5)

## 2010-09-20 ENCOUNTER — Emergency Department (HOSPITAL_COMMUNITY)
Admission: EM | Admit: 2010-09-20 | Discharge: 2010-09-21 | Disposition: A | Discharge status: Home / Self Care | Payer: Self-pay | Attending: Emergency Medicine | Admitting: Emergency Medicine

## 2010-09-20 DIAGNOSIS — R05 Cough: Secondary | ICD-10-CM | POA: Insufficient documentation

## 2010-09-20 DIAGNOSIS — R0602 Shortness of breath: Secondary | ICD-10-CM | POA: Insufficient documentation

## 2010-09-20 DIAGNOSIS — J449 Chronic obstructive pulmonary disease, unspecified: Secondary | ICD-10-CM | POA: Insufficient documentation

## 2010-09-20 DIAGNOSIS — R059 Cough, unspecified: Secondary | ICD-10-CM | POA: Insufficient documentation

## 2010-09-20 DIAGNOSIS — R062 Wheezing: Secondary | ICD-10-CM | POA: Insufficient documentation

## 2010-09-20 DIAGNOSIS — J4489 Other specified chronic obstructive pulmonary disease: Secondary | ICD-10-CM | POA: Insufficient documentation

## 2010-09-21 ENCOUNTER — Emergency Department (HOSPITAL_COMMUNITY): Payer: Self-pay
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
## 2010-10-01 NOTE — Discharge Summary (Signed)
NAMEJAHQUAN, Andrew [name]                ACCOUNT NO.:  [id number]  MEDICAL RECORD NO.:  [id number]           PATIENT TYPE:  I  LOCATION:  5034                         FACILITY:  [facility]  PHYSICIAN:  [name], MD       DATE OF BIRTH:  Oct 26, 1970  DATE OF ADMISSION:  09/15/2010 DATE OF DISCHARGE:  09/18/2010                        DISCHARGE SUMMARY - REFERRING   The patient has no primary care physician right now, but we are in the process of establishing care with Dr. Philipp Deputy at [facility].  DISCHARGE DIAGNOSES: 1. Acute chronic obstructive pulmonary disease exacerbation. 2. Esophagitis, most likely from alcohol abuse. 3. Hematuria with a negative CT urogram. 4. Alcoholism, chronic pain. 5. Tobacco abuse.  DISCHARGE MEDICATIONS: 1. Albuterol high-flow inhaler 4 puffs inhaled four times daily. 2. Folic acid 1 mg p.o. daily. 3. Vicodin 1-2 tablets every 4 hours as needed. 4. Levaquin 750 mg p.o. daily for 6 days. 5. Multivitamins 1 capsule p.o. daily. 6. Nicotine patch 21 mg for 24 hours transdermally daily. 7. Prednisone taper 60 mg two days, 50 mg two days, 40 mg two days, 30     mg two days, 20 mg two days, 10 mg two days. 8. Pantoprazole 40 mg p.o. daily. 9. Thiamine 100 mg p.o. daily. 10.Spiriva 18 mcg inhaled daily. 11.Klonopin 1 tablet p.o. twice daily. 12.Tylenol Extra Strength one to two tablets by mouth every 6 hours as     needed. The patient was also explained never to use Klonopin with alcohol.  CONSULTATIONS ON THIS CASE:  None.  PROCEDURES PERFORMED: 1. The patient had one-view chest x-ray which showed minimal left     basilar atelectasis noted, lungs otherwise clear. 2. CT scan of the abdomen and pelvis showed no evidence of     urolithiasis, hydronephrosis, or other acute findings within the     abdomen or pelvis.  Decreased hepatic steatosis since prior exam.     Increased mild bibasilar atelectasis.  FOLLOWUP:  The patient should follow up with Dr.  Philipp Deputy on October 11, 2010, at 9:00 a.m.  The patient also has an eligibility with [facility] appointment at 2:15 p.m. on October 07, 2010.  DIET:  Currently no restrictions.  We are also in the process of testing the patient to see if he needs home oxygen or not.  BRIEF HISTORY OF PRESENT ILLNESS:  Chief complaint was shortness of breath.  This is a 40 year old gentleman with polysubstance abuse including alcohol and tobacco who was brought into the emergency room on Sep 15, 2010, at 4 a.m. with acute shortness of breath.  The patient was found to be in respiratory distress and intoxicated with alcohol.  HOSPITAL COURSE: 1. Acute COPD exacerbation.  The patient was placed on Solu-Medrol as     well as Levaquin and nebulizations.  The patient's oxygenation     improved.  The patient is feeling better on Sep 19, 2010, and was     deemed ready for discharge.  He will be discharged home on     albuterol inhaler, Spiriva, also prednisone taper.  The patient     will also get  Levaquin 750 mg p.o. daily for 6 days.  He should     have followup with [facility]. 2. Esophagitis.  The patient most likely has esophagitis.  This is     most likely from alcohol abuse.  The patient had cardiac enzymes     drawn that were negative x3.  The patient also had a D-dimer that     was negative.  The patient will be sent home on a PPI. 3. Hematuria.  The patient did complain of some intermittent     hematuria.  The patient had a CT urogram which did not show any     abnormalities. 4. Alcoholism, chronic pain.  The patient was counseled on alcoholism.     The patient reports that he will no longer drink. 5. DVT prophylaxis.  The patient received Lovenox for DVT prophylaxis. 6. Tobacco abuse.  The patient was counseled.     [name], MD     NH/MEDQ  D:  09/19/2010  T:  09/19/2010  Job:  [id number]  Electronically Signed by [name] MD on 10/01/2010 12:33:32 PM

## 2010-10-23 NOTE — H&P (Signed)
Andrew [name], Andrew [name]                ACCOUNT NO.:  [id number]  MEDICAL RECORD NO.:  [id number]           PATIENT TYPE:  E  LOCATION:  [facility]                         FACILITY:  [facility]  PHYSICIAN:  Zannie Cove, MD     DATE OF BIRTH:  Sep 25, 1970  DATE OF ADMISSION:  09/02/2010 DATE OF DISCHARGE:                             HISTORY & PHYSICAL   PRIMARY CARE PHYSICIAN:  None.  CHIEF COMPLAINT:  In the hospital to detox and low O2 sats per EDP.  HISTORY OF PRESENT ILLNESS:  Andrew [name] is a 40 year old Caucasian gentleman with history of prescription narcotic abuse, alcohol abuse, and tobacco use presented to the ER yesterday in order to detox from all of these because he says that he was fed up of his lifestyle. Subsequently, had been in the ER since then, initially noted to have O2 sats of 90% on room air.  Subsequently through his hospital stay, he has been complaining of chest tightness and wheezing.  In the ED, physicians attempted several doses of albuterol and had to pump up his oxygen to 4 L now to keep his sats above 90% and hence [facility] hospitalist was consulted for this.  The patient reports that he has been having cough, congestion, shortness of breath and wheezing for about 2 weeks now.  He went to [facility] 2 weeks ago.  He was prescribed Z-Pak steroids and inhaler.  He got a little better initially but then continued to get worse.  At this point, he reports that he has been having this persistent nonproductive cough and wheezing associated with chest tightness.  No fevers or chills.  PAST MEDICAL HISTORY:  Significant for, 1. Polysubstance abuse. 2. History of cocaine abuse. 3. Hypertension. 4. Bronchitis. 5. Tobacco use. 6. History of nonobstructive coronary disease per cardiac cath in     2004.  CURRENT MEDICATIONS:  At home include Zoloft, Neurontin, trazodone, and albuterol, doses of all which are unknown.  ALLERGIES:  Penicillin And Toradol.  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 48 cans of beer in a day and has been doing so for the last several weeks, previously used to drink one case a day.  In addition, smokes 1 pack per day for 25 years and abuses prescription pain medicines on a daily basis says and says that he uses Percocet, OxyContin, oxycodone all of which he buys on the street.  Previously, he used to use cocaine and marijuana, currently addicted to prescription pain medicines.  FAMILY HISTORY:  Positive for cancer, diabetes, and hypertension.  REVIEW OF SYSTEMS:  Negative except per HPI.  PHYSICAL EXAM:  VITAL SIGNS:  Temp is 98.2, pulse is 104, blood pressure 128/60, respirations 18, satting 92%, on 4 L. GENERAL:  He is a well-built Caucasian gentleman laying in the stretcher in no acute distress. HEENT:  Pupils round, reactive to light.  Extraocular movements intact. Oral mucosa is dry. NECK:  No JVD or lymphadenopathy. CARDIOVASCULAR : S1 and S2.  Regular rate and rhythm. LUNGS:  Decreased breath sounds through all his upper and middle lobes. Scant crackles at both bases. ABDOMEN:  Soft, nontender.  Positive bowel sounds with no organomegaly. EXTREMITIES:  No edema, clubbing, or cyanosis. SKIN:  He has tattoos throughout his body.  LABORATORY DATA:  Review of laboratory data shows normal CBC, normal BMET, mildly elevated AST and ALT.  Tylenol level negative.  Alcohol is 69.  Positive amphetamines, positive benzos, and positive opiates and a tox screen.  ASSESSMENT AND PLAN:  Andrew [name] is a 40 year old gentleman with, 1. Acute bronchitis with chronic obstructive pulmonary disease     exacerbation with hypoxia.  We will start him on IV Avelox, IV Solu-     Medrol round-the-clock, albuterol and Atrovent nebulizers, O2 to     keep sats greater than 90%. 2. Polysubstance abuse, most  notably prescription narcotic abuse.  We     will consult Psych. 3. Alcohol abuse, start him on thiamine, multivitamin p.o. protocol. 4. Further management as condition evolves.     Zannie Cove, MD     PJ/MEDQ  D:  09/02/2010  T:  09/02/2010  Job:  [id number]  Electronically Signed by Zannie Cove  on 10/23/2010 02:14:04 PM

## 2010-11-30 ENCOUNTER — Emergency Department (HOSPITAL_COMMUNITY)
Admission: EM | Admit: 2010-11-30 | Discharge: 2010-12-01 | Disposition: A | Discharge status: Home / Self Care | Payer: Self-pay | Attending: Emergency Medicine | Admitting: Emergency Medicine

## 2010-11-30 ENCOUNTER — Emergency Department (HOSPITAL_COMMUNITY): Payer: Self-pay

## 2010-11-30 DIAGNOSIS — Z79899 Other long term (current) drug therapy: Secondary | ICD-10-CM | POA: Insufficient documentation

## 2010-11-30 DIAGNOSIS — R05 Cough: Secondary | ICD-10-CM | POA: Insufficient documentation

## 2010-11-30 DIAGNOSIS — R0789 Other chest pain: Secondary | ICD-10-CM | POA: Insufficient documentation

## 2010-11-30 DIAGNOSIS — R51 Headache: Secondary | ICD-10-CM | POA: Insufficient documentation

## 2010-11-30 DIAGNOSIS — R062 Wheezing: Secondary | ICD-10-CM | POA: Insufficient documentation

## 2010-11-30 DIAGNOSIS — J4489 Other specified chronic obstructive pulmonary disease: Secondary | ICD-10-CM | POA: Insufficient documentation

## 2010-11-30 DIAGNOSIS — F17200 Nicotine dependence, unspecified, uncomplicated: Secondary | ICD-10-CM | POA: Insufficient documentation

## 2010-11-30 DIAGNOSIS — J449 Chronic obstructive pulmonary disease, unspecified: Secondary | ICD-10-CM | POA: Insufficient documentation

## 2010-11-30 DIAGNOSIS — R0602 Shortness of breath: Secondary | ICD-10-CM | POA: Insufficient documentation

## 2010-11-30 DIAGNOSIS — R059 Cough, unspecified: Secondary | ICD-10-CM | POA: Insufficient documentation

## 2010-11-30 DIAGNOSIS — R5381 Other malaise: Secondary | ICD-10-CM | POA: Insufficient documentation

## 2010-11-30 DIAGNOSIS — N419 Inflammatory disease of prostate, unspecified: Secondary | ICD-10-CM | POA: Insufficient documentation

## 2010-11-30 LAB — CBC
HCT: 41.5 % (ref 39.0–52.0)
Hemoglobin: 14.4 g/dL (ref 13.0–17.0)
MCV: 91.6 fL (ref 78.0–100.0)
RDW: 13.6 % (ref 11.5–15.5)
WBC: 10.7 10*3/uL — ABNORMAL HIGH (ref 4.0–10.5)

## 2010-11-30 LAB — DIFFERENTIAL
Eosinophils Relative: 5 % (ref 0–5)
Lymphocytes Relative: 25 % (ref 12–46)
Lymphs Abs: 2.7 10*3/uL (ref 0.7–4.0)
Neutro Abs: 6.8 10*3/uL (ref 1.7–7.7)

## 2010-12-01 LAB — BASIC METABOLIC PANEL
BUN: 11 mg/dL (ref 6–23)
CO2: 27 mEq/L (ref 19–32)
Chloride: 104 mEq/L (ref 96–112)
Creatinine, Ser: 1.12 mg/dL (ref 0.50–1.35)
Glucose, Bld: 96 mg/dL (ref 70–99)
Potassium: 3.8 mEq/L (ref 3.5–5.1)

## 2010-12-01 LAB — URINALYSIS, ROUTINE W REFLEX MICROSCOPIC
Glucose, UA: NEGATIVE mg/dL
Nitrite: POSITIVE — AB
Specific Gravity, Urine: 1.021 (ref 1.005–1.030)
pH: 5.5 (ref 5.0–8.0)

## 2010-12-01 LAB — D-DIMER, QUANTITATIVE: D-Dimer, Quant: 0.22 ug/mL-FEU (ref 0.00–0.48)

## 2010-12-01 LAB — URINE MICROSCOPIC-ADD ON

## 2010-12-02 LAB — URINE CULTURE: Culture  Setup Time: 201208121818
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
## 2019-08-04 ENCOUNTER — Other Ambulatory Visit: Payer: Self-pay

## 2019-08-04 ENCOUNTER — Encounter (HOSPITAL_COMMUNITY): Payer: Self-pay | Admitting: Emergency Medicine

## 2019-08-04 ENCOUNTER — Emergency Department (HOSPITAL_COMMUNITY)
Admission: EM | Admit: 2019-08-04 | Discharge: 2019-08-04 | Discharge status: Against Medical Advice | Payer: Medicaid - Out of State | Attending: Emergency Medicine | Admitting: Emergency Medicine

## 2019-08-04 DIAGNOSIS — E119 Type 2 diabetes mellitus without complications: Secondary | ICD-10-CM | POA: Diagnosis not present

## 2019-08-04 DIAGNOSIS — I2510 Atherosclerotic heart disease of native coronary artery without angina pectoris: Secondary | ICD-10-CM | POA: Insufficient documentation

## 2019-08-04 DIAGNOSIS — Z955 Presence of coronary angioplasty implant and graft: Secondary | ICD-10-CM | POA: Diagnosis not present

## 2019-08-04 DIAGNOSIS — M5441 Lumbago with sciatica, right side: Secondary | ICD-10-CM | POA: Insufficient documentation

## 2019-08-04 DIAGNOSIS — M545 Low back pain: Secondary | ICD-10-CM | POA: Diagnosis present

## 2019-08-04 HISTORY — DX: Type 2 diabetes mellitus without complications: E11.9

## 2019-08-04 LAB — CBG MONITORING, ED: Glucose-Capillary: 179 mg/dL — ABNORMAL HIGH (ref 70–99)

## 2019-08-04 NOTE — ED Triage Notes (Signed)
Pt c/o back pain x 3-4 days that radiates to right buttocks and down right leg.

## 2019-08-04 NOTE — ED Notes (Signed)
Pt states he is not taking antiinflammatory med for pain. Pt states he is leaving.

## 2019-08-04 NOTE — ED Provider Notes (Signed)
[facility] EMERGENCY DEPARTMENT Provider Note   CSN: [id number] Arrival date & time: 08/04/19  0000   Time seen 12:42 AM  History Chief Complaint  Patient presents with  . Back Pain    DWAYN MORAVEK is a 49 y.o. male.  HPI   Patient states his doctors are in [place], not at the [facility] hospital but private doctors.  He states for the past month he has had some low back pain.  He denies any injury.  He states he has been trying to get into see a specialist and then later on he told me he has an appointment on the 29th with a neurosurgeon in [place].  He states his primary care doctor cannot write him any medications because he is not licensed to.  When I ask him if he needs any kind of medicine he states yes.  He denies being put on any steroids or anti-inflammatory medications or even muscle relaxers.  He states his pain in his lower back and then it radiates into his right buttock and down his posterior leg into his foot and states sometimes his toes get numb.  He states the radiation in his leg is only been there a couple of days but the low back pain is been there for a month.  He states walking, changing positions, and heat make the pain worse, ice helps briefly.  He denies any rectal or urinary incontinence.  He denies having any prior studies done.  Patient states his primary care doctor told him he could not have any anti-inflammatory drugs because he had 2 cardiac stents placed in August.  Patient denies any prior back problems to me although in his nurses notes he relates he had back surgery before.  PCP Dr Loleta Chance in [place], [place]  Past Medical History:  Diagnosis Date  . Diabetes mellitus without complication ([place])   Coronary artery disease with 2 stents placed in August 2020  There are no problems to display for this patient.   Past Surgical History:  Procedure Laterality Date  . BACK SURGERY    . CARDIAC SURGERY         No family history on file.  Social  History   Tobacco Use  . Smoking status: Current Every Day Smoker    Types: Cigarettes  . Smokeless tobacco: Former Network engineer Use Topics  . Alcohol use: Not Currently  . Drug use: Not Currently  smokes 1/2 ppd   Home Medications Prior to Admission medications   Not on File  Patient states he is on lisinopril, aspirin 81 mg a day, a cholesterol pill, insulin, Trulicity, Metformin, and another diabetic medication twice a day.  Allergies    Penicillins and Toradol [ketorolac tromethamine]  Review of Systems   Review of Systems  All other systems reviewed and are negative.   Physical Exam Updated Vital Signs BP (!) 157/109   Pulse 90   Temp 98 F (36.7 C) (Oral)   Resp 17   Ht (P) 5\' 7"  (1.702 m)   Wt (P) 93.9 kg   SpO2 98%   BMI (P) 32.42 kg/m   Physical Exam Vitals and nursing note reviewed.  Constitutional:      General: He is in acute distress.     Appearance: Normal appearance. He is normal weight.     Comments: Appears uncomfortable  HENT:     Head: Normocephalic.     Right Ear: External ear normal.  Left Ear: External ear normal.  Eyes:     Extraocular Movements: Extraocular movements intact.     Conjunctiva/sclera: Conjunctivae normal.     Pupils: Pupils are equal, round, and reactive to light.  Cardiovascular:     Rate and Rhythm: Normal rate.  Pulmonary:     Effort: Pulmonary effort is normal. No respiratory distress.  Musculoskeletal:     Cervical back: Normal range of motion and neck supple.     Comments: Patient is nontender in the thoracic or lumbar spine, he is tender over the right SI joint and in the right sciatic notch.  His patellar reflexes are 2+ and equal bilaterally.  He has positive straight leg raising on both sides and his right buttock.  He states range of motion of the lumbar spine to the right makes the pain worse as does extreme forward flexion.  Lumbar range of motion to the left does not cause pain.  Skin:    Comments:  Patient has 1 teardrop tattoo near each eye  Neurological:     General: No focal deficit present.     Mental Status: He is alert and oriented to person, place, and time.     Cranial Nerves: No cranial nerve deficit.  Psychiatric:        Mood and Affect: Mood normal.        Behavior: Behavior normal.        Thought Content: Thought content normal.     ED Results / Procedures / Treatments   Labs (all labs ordered are listed, but only abnormal results are displayed) Labs Reviewed  CBG MONITORING, ED - Abnormal; Notable for the following components:      Result Value   Glucose-Capillary 179 (*)    All other components within normal limits    EKG None  Radiology No results found.  Procedures Procedures (including critical care time)  Medications Ordered in ED Medications - No data to display  ED Course  I have reviewed the triage vital signs and the nursing notes.  Pertinent labs & imaging results that were available during my care of the patient were reviewed by me and considered in my medical decision making (see chart for details).    MDM Rules/Calculators/A&P                     I explained to the patient I do not have MRI at night and that would probably be something the neurosurgeon would decide if he needed.  He has no red flags at this point to suggest he needed an emergency MRI.  I discussed with him that we normally treat this with anti-inflammatories or steroids and muscle relaxers.  I offered to give him some initial injections here tonight.  Patient states he was told because a his stents he cannot have anti-inflammatory drugs however I have never heard of that.  I discussed that his symptoms were consistent with sciatica which is a pressure on the nerve and these medicines would help relieve that pressure.  1:17 AM I just noticed that patient is no longer in the room.  Final Clinical Impression(s) / ED Diagnoses Final diagnoses:  Acute right-sided low back  pain with right-sided sciatica    Rx / DC Orders   Pt eloped  Devoria Albe, MD, Concha Pyo, MD 08/04/19 [phone]

## 2022-04-03 ENCOUNTER — Emergency Department (HOSPITAL_COMMUNITY): Payer: Medicaid - Out of State

## 2022-04-03 ENCOUNTER — Other Ambulatory Visit: Payer: Self-pay

## 2022-04-03 ENCOUNTER — Encounter (HOSPITAL_COMMUNITY): Payer: Self-pay

## 2022-04-03 ENCOUNTER — Emergency Department (HOSPITAL_COMMUNITY)
Admission: EM | Admit: 2022-04-03 | Discharge: 2022-04-03 | Disposition: A | Discharge status: Home / Self Care | Payer: Medicaid - Out of State | Attending: Emergency Medicine | Admitting: Emergency Medicine

## 2022-04-03 DIAGNOSIS — G8929 Other chronic pain: Secondary | ICD-10-CM | POA: Diagnosis not present

## 2022-04-03 DIAGNOSIS — I10 Essential (primary) hypertension: Secondary | ICD-10-CM | POA: Diagnosis not present

## 2022-04-03 DIAGNOSIS — J449 Chronic obstructive pulmonary disease, unspecified: Secondary | ICD-10-CM | POA: Insufficient documentation

## 2022-04-03 DIAGNOSIS — M545 Low back pain, unspecified: Secondary | ICD-10-CM | POA: Insufficient documentation

## 2022-04-03 DIAGNOSIS — M542 Cervicalgia: Secondary | ICD-10-CM | POA: Insufficient documentation

## 2022-04-03 DIAGNOSIS — E119 Type 2 diabetes mellitus without complications: Secondary | ICD-10-CM | POA: Diagnosis not present

## 2022-04-03 DIAGNOSIS — W109XXA Fall (on) (from) unspecified stairs and steps, initial encounter: Secondary | ICD-10-CM | POA: Diagnosis not present

## 2022-04-03 DIAGNOSIS — I2510 Atherosclerotic heart disease of native coronary artery without angina pectoris: Secondary | ICD-10-CM | POA: Diagnosis not present

## 2022-04-03 MED ORDER — DIAZEPAM 5 MG/ML IJ SOLN
5.0000 mg | Freq: Once | INTRAMUSCULAR | Status: AC
  Administered 2022-04-03: 5 mg via INTRAVENOUS
  Filled 2022-04-03: qty 2

## 2022-04-03 MED ORDER — MORPHINE SULFATE (PF) 4 MG/ML IV SOLN
4.0000 mg | Freq: Once | INTRAVENOUS | Status: AC
  Administered 2022-04-03: 4 mg via INTRAVENOUS
  Filled 2022-04-03: qty 1

## 2022-04-03 MED ORDER — DEXAMETHASONE SODIUM PHOSPHATE 10 MG/ML IJ SOLN
10.0000 mg | Freq: Once | INTRAMUSCULAR | Status: AC
  Administered 2022-04-03: 10 mg via INTRAVENOUS
  Filled 2022-04-03: qty 1

## 2022-04-03 MED ORDER — PREDNISONE 50 MG PO TABS: 50.0000 mg | ORAL_TABLET | Freq: Every day | ORAL | 0 refills | Status: AC

## 2022-04-03 MED ORDER — ACETAMINOPHEN 500 MG PO TABS
1000.0000 mg | ORAL_TABLET | Freq: Once | ORAL | Status: AC
  Administered 2022-04-03: 1000 mg via ORAL
  Filled 2022-04-03: qty 2

## 2022-04-03 NOTE — ED Provider Notes (Signed)
[facility] EMERGENCY DEPARTMENT Provider Note   CSN: [id number] Arrival date & time: 04/03/22  2001     History  Chief Complaint  Patient presents with   Andrew [name] is a 51 y.o. male.  Pt is a 51 yo male with pmhx significant for dm, cva, htn, copd, cad, and chronic pain.  Pt said he tripped over his dog and fell down 8 stairs.  He complains of neck and back pain.  However, he went to an ED in [place], [place] and said he fell getting out of his bath tub and has pain.  He had 42 oxycodone 10s filled on 11/30 that was written by his NS.  There is a note in epic that he called his NS yesterday and said he is out of his pain medicine.  Pt has had multiple visits to the ED close to where he lives for pain complaints.       Home Medications Prior to Admission medications   Medication Sig Start Date End Date Taking? Authorizing Provider  predniSONE (DELTASONE) 50 MG tablet Take 1 tablet (50 mg total) by mouth daily with breakfast. 04/03/22  Yes Jacalyn Lefevre, MD      Allergies    Penicillins and Toradol [ketorolac tromethamine]    Review of Systems   Review of Systems  Musculoskeletal:  Positive for back pain and neck pain.  All other systems reviewed and are negative.   Physical Exam Updated Vital Signs BP (!) 185/110   Pulse 69   Temp 98.1 F (36.7 C) (Oral)   Resp 18   SpO2 96%  Physical Exam Vitals and nursing note reviewed.  Constitutional:      Appearance: Normal appearance.  HENT:     Head: Normocephalic and atraumatic.     Right Ear: External ear normal.     Left Ear: External ear normal.     Nose: Nose normal.     Mouth/Throat:     Mouth: Mucous membranes are moist.     Pharynx: Oropharynx is clear.  Eyes:     Extraocular Movements: Extraocular movements intact.     Conjunctiva/sclera: Conjunctivae normal.     Pupils: Pupils are equal, round, and reactive to light.  Neck:   Cardiovascular:     Rate and Rhythm: Normal rate and regular  rhythm.     Pulses: Normal pulses.     Heart sounds: Normal heart sounds.  Pulmonary:     Effort: Pulmonary effort is normal.     Breath sounds: Normal breath sounds.  Abdominal:     General: Abdomen is flat. Bowel sounds are normal.     Palpations: Abdomen is soft.  Musculoskeletal:        General: Normal range of motion.       Arms:     Cervical back: Pain with movement present.  Skin:    General: Skin is warm.     Capillary Refill: Capillary refill takes less than 2 seconds.  Neurological:     General: No focal deficit present.     Mental Status: He is alert and oriented to person, place, and time.  Psychiatric:        Mood and Affect: Mood normal.        Behavior: Behavior normal.     ED Results / Procedures / Treatments   Labs (all labs ordered are listed, but only abnormal results are displayed) Labs Reviewed - No data to display  EKG EKG  Interpretation  Date/Time:  Thursday April 03 2022 20:32:17 EST Ventricular Rate:  79 PR Interval:  162 QRS Duration: 94 QT Interval:  388 QTC Calculation: 444 R Axis:   86 Text Interpretation: Normal sinus rhythm Nonspecific ST abnormality Abnormal ECG No significant change since last tracing Confirmed by Jacalyn Lefevre [phone]) on 04/03/2022 10:13:00 PM  Radiology DG Lumbar Spine Complete  Result Date: 04/03/2022 CLINICAL DATA:  Back pain. EXAM: LUMBAR SPINE - COMPLETE 4+ VIEW COMPARISON:  Lumbar spine series 03/18/2022 FINDINGS: Osteopenia. Slight levoscoliosis apex T12-L1 again noted and a slight grade 1 retrolisthesis at L1-2 and L4-5. No new or further alignment abnormality is seen or acute fracture. A moderate chronic upper plate anterior wedge compression fracture is again noted of the L1 vertebral body. The other vertebra are normal in heights. Anterior bridging osteophytes noted at L1-2, L2-3 and L4-5 with L4-5 again demonstrating posterior fusion rods, pedicle screws and interbody metal hardware with the disc space  still visible. Above and below the level of fusion, there are normal lumbar disc heights. There is mild facet hypertrophy from L2-3 inferiorly. Ankylosis is seen both SI joints. There is aortic atherosclerosis. No visible nephrolithiasis. IMPRESSION: 1. Osteopenia, degenerative and postsurgical changes without evidence of acute fracture or interval change. 2. Stable chronic L1 compression fracture. 3. Aortic atherosclerosis. 4. Ankylosis of both SI joints. 5. No new alignment abnormality or significant change since 03/18/2022. Electronically Signed   By: Almira Bar M.D.   On: 04/03/2022 22:10    Procedures Procedures    Medications Ordered in ED Medications  acetaminophen (TYLENOL) tablet 1,000 mg (has no administration in time range)  morphine (PF) 4 MG/ML injection 4 mg (4 mg Intravenous Given 04/03/22 2136)  diazepam (VALIUM) injection 5 mg (5 mg Intravenous Given 04/03/22 2138)  dexamethasone (DECADRON) injection 10 mg (10 mg Intravenous Given 04/03/22 2137)    ED Course/ Medical Decision Making/ A&P                           Medical Decision Making Amount and/or Complexity of Data Reviewed Radiology: ordered.  Risk Prescription drug management.   This patient presents to the ED for concern of fall, this involves an extensive number of treatment options, and is a complaint that carries with it a high risk of complications and morbidity.  The differential diagnosis includes multiple trauma   Co morbidities that complicate the patient evaluation   dm, cva, htn, copd, cad, and chronic pain   Additional history obtained:  Additional history obtained from epic chart review External records from outside source obtained and reviewed including family   Imaging Studies ordered:  I ordered imaging studies including lumbar spine  I independently visualized and interpreted imaging which showed   Osteopenia, degenerative and postsurgical changes without  evidence of acute  fracture or interval change.  2. Stable chronic L1 compression fracture.  3. Aortic atherosclerosis.  4. Ankylosis of both SI joints.  5. No new alignment abnormality or significant change since  03/18/2022.   I agree with the radiologist interpretation   Medicines ordered and prescription drug management:  I ordered medication including morphine/valium/decadron  for pain  Reevaluation of the patient after these medicines showed that the patient improved I have reviewed the patients home medicines and have made adjustments as needed   Problem List / ED Course:  Fall:  As pt was in the ED in [place] with a "fall," I suspect he is drug  seeking and hospital shopping.  He is probably hoping we would not be able to see his ED visits in [place].  I gave him pain meds here before I had a chance to read his chart.  However, he will not be getting any narcotic rx from here.  Pt will be d/c with prednisone.  He is to return if worse.  F/u with his NS.   Reevaluation:  After the interventions noted above, I reevaluated the patient and found that they have :improved   Social Determinants of Health:  Lives at home   Dispostion:  After consideration of the diagnostic results and the patients response to treatment, I feel that the patent would benefit from discharge with outpatient tx.          Final Clinical Impression(s) / ED Diagnoses Final diagnoses:  Chronic neck pain  Chronic bilateral low back pain without sciatica    Rx / DC Orders ED Discharge Orders          Ordered    predniSONE (DELTASONE) 50 MG tablet  Daily with breakfast        04/03/22 2219              Jacalyn Lefevre, MD 04/03/22 2219

## 2022-04-03 NOTE — ED Triage Notes (Signed)
Pt arrived from home via POV s/p fall down 8 stairs first point of contact was back of neck. Per pt he had recent fusion of L4 and L5 with cage. Pt states that when he turns his head to the right his right shoulder and arm has shooting pains and feelings of being asleep. 10/10 pain for neck and lower back

## 2022-04-03 NOTE — ED Notes (Signed)
Patient transported to X-ray
